# Patient Record
Sex: Male | Born: 1950 | Race: White | Hispanic: No | Marital: Single | Smoking: Never smoker
Health system: Southern US, Community
[De-identification: ages and names within clinical notes are randomized; demographics above are authoritative.]

## PROBLEM LIST (undated history)

## (undated) DIAGNOSIS — I10 Essential (primary) hypertension: Secondary | ICD-10-CM

## (undated) DIAGNOSIS — G510 Bell's palsy: Secondary | ICD-10-CM

## (undated) DIAGNOSIS — N289 Disorder of kidney and ureter, unspecified: Secondary | ICD-10-CM

## (undated) HISTORY — PX: TONSILECTOMY, ADENOIDECTOMY, BILATERAL MYRINGOTOMY AND TUBES: SHX2538

## (undated) HISTORY — PX: NASAL SEPTUM SURGERY: SHX37

---

## 2014-02-26 DIAGNOSIS — I10 Essential (primary) hypertension: Secondary | ICD-10-CM | POA: Insufficient documentation

## 2015-03-13 DIAGNOSIS — Z0000 Encounter for general adult medical examination without abnormal findings: Secondary | ICD-10-CM | POA: Insufficient documentation

## 2017-01-24 ENCOUNTER — Encounter: Payer: Self-pay | Admitting: Emergency Medicine

## 2017-01-24 ENCOUNTER — Emergency Department: Payer: BLUE CROSS/BLUE SHIELD

## 2017-01-24 ENCOUNTER — Emergency Department
Admission: EM | Admit: 2017-01-24 | Discharge: 2017-01-25 | Disposition: A | Discharge status: Home / Self Care | Payer: BLUE CROSS/BLUE SHIELD | Attending: Emergency Medicine | Admitting: Emergency Medicine

## 2017-01-24 DIAGNOSIS — G510 Bell's palsy: Secondary | ICD-10-CM | POA: Insufficient documentation

## 2017-01-24 DIAGNOSIS — R112 Nausea with vomiting, unspecified: Secondary | ICD-10-CM | POA: Diagnosis not present

## 2017-01-24 DIAGNOSIS — R1011 Right upper quadrant pain: Secondary | ICD-10-CM | POA: Insufficient documentation

## 2017-01-24 HISTORY — DX: Essential (primary) hypertension: I10

## 2017-01-24 HISTORY — DX: Bell's palsy: G51.0

## 2017-01-24 HISTORY — DX: Disorder of kidney and ureter, unspecified: N28.9

## 2017-01-24 LAB — URINALYSIS, COMPLETE (UACMP) WITH MICROSCOPIC
Bilirubin Urine: NEGATIVE
Glucose, UA: NEGATIVE mg/dL
HGB URINE DIPSTICK: NEGATIVE
KETONES UR: 80 mg/dL — AB
LEUKOCYTES UA: NEGATIVE
NITRITE: NEGATIVE
PROTEIN: NEGATIVE mg/dL
Specific Gravity, Urine: 1.026 (ref 1.005–1.030)
Squamous Epithelial / LPF: NONE SEEN
pH: 6 (ref 5.0–8.0)

## 2017-01-24 LAB — CBC
HCT: 44.9 % (ref 40.0–52.0)
Hemoglobin: 15.4 g/dL (ref 13.0–18.0)
MCH: 30.5 pg (ref 26.0–34.0)
MCHC: 34.3 g/dL (ref 32.0–36.0)
MCV: 88.7 fL (ref 80.0–100.0)
Platelets: 193 10*3/uL (ref 150–440)
RBC: 5.06 MIL/uL (ref 4.40–5.90)
RDW: 12.7 % (ref 11.5–14.5)
WBC: 15 10*3/uL — AB (ref 3.8–10.6)

## 2017-01-24 LAB — COMPREHENSIVE METABOLIC PANEL
ALK PHOS: 79 U/L (ref 38–126)
ALT: 22 U/L (ref 17–63)
ANION GAP: 12 (ref 5–15)
AST: 29 U/L (ref 15–41)
Albumin: 4.6 g/dL (ref 3.5–5.0)
BILIRUBIN TOTAL: 1.2 mg/dL (ref 0.3–1.2)
BUN: 18 mg/dL (ref 6–20)
CALCIUM: 9.9 mg/dL (ref 8.9–10.3)
CO2: 25 mmol/L (ref 22–32)
Chloride: 103 mmol/L (ref 101–111)
Creatinine, Ser: 1.21 mg/dL (ref 0.61–1.24)
GFR calc non Af Amer: >60 mL/min (ref >60)
Glucose, Bld: 188 mg/dL — ABNORMAL HIGH (ref 65–99)
Potassium: 4 mmol/L (ref 3.5–5.1)
Sodium: 140 mmol/L (ref 135–145)
TOTAL PROTEIN: 8.1 g/dL (ref 6.5–8.1)

## 2017-01-24 LAB — LIPASE, BLOOD: Lipase: 23 U/L (ref 11–51)

## 2017-01-24 MED ORDER — ONDANSETRON HCL 4 MG/2ML IJ SOLN
4.0000 mg | Freq: Once | INTRAMUSCULAR | Status: AC
  Administered 2017-01-24: 4 mg via INTRAVENOUS
  Filled 2017-01-24: qty 2

## 2017-01-24 MED ORDER — FENTANYL CITRATE (PF) 100 MCG/2ML IJ SOLN
50.0000 ug | INTRAMUSCULAR | Status: DC | PRN
  Administered 2017-01-24: 50 ug via NASAL
  Filled 2017-01-24: qty 2

## 2017-01-24 MED ORDER — IOPAMIDOL (ISOVUE-300) INJECTION 61%
100.0000 mL | Freq: Once | INTRAVENOUS | Status: AC | PRN
  Administered 2017-01-24: 100 mL via INTRAVENOUS

## 2017-01-24 MED ORDER — SODIUM CHLORIDE 0.9 % IV BOLUS (SEPSIS)
1000.0000 mL | Freq: Once | INTRAVENOUS | Status: AC
  Administered 2017-01-24: 1000 mL via INTRAVENOUS

## 2017-01-24 MED ORDER — MORPHINE SULFATE (PF) 4 MG/ML IV SOLN
4.0000 mg | Freq: Once | INTRAVENOUS | Status: AC
  Administered 2017-01-24: 4 mg via INTRAVENOUS
  Filled 2017-01-24: qty 1

## 2017-01-24 NOTE — Discharge Instructions (Signed)
Constipation: Take Miralax 1 cap full in the morning and one in the evening for 3-5 days. Take colace twice a day everyday. Take senna once a day at bedtime. Take daily probiotics. Drink plenty of fluids and eat a diet rich in fiber.   You have been seen in the Emergency Department (ED) for abdominal pain.  Your evaluation did not identify a clear cause of your symptoms but was generally reassuring.  Abdominal pain has many possible causes. Some aren't serious and get better on their own in a few days. Others need more testing and treatment. If your pain continues or gets worse, you need to be rechecked and may need more tests to find out what is wrong. You may need surgery to correct the problem.   Follow up with your doctor in 12-24 hours if you are still having abdominal pain. Otherwise follow up in 1-3 days for a re-check  Don't ignore new symptoms, such as fever, nausea and vomiting, new or worsening abdominal pain, urination problems, bloody diarrhea or bloody stools, black tarry stools, uncontrollable nausea and vomiting, and dizziness. These may be signs of a more serious problem. If you develop any of these you should be seen by your doctor immediately or return to the ED.   How can you care for yourself at home?  Rest until you feel better.  To prevent dehydration, drink plenty of fluids, enough so that your urine is light yellow or clear like water. Choose water and other caffeine-free clear liquids until you feel better. If you have kidney, heart, or liver disease and have to limit fluids, talk with your doctor before you increase the amount of fluids you drink.  If your stomach is upset, eat mild foods, such as rice, dry toast or crackers, bananas, and applesauce. Try eating several small meals instead of two or three large ones.  Wait until 48 hours after all symptoms have gone away before you have spicy foods, alcohol, and drinks that contain caffeine.  Do not eat foods that are high  in fat.  Avoid anti-inflammatory medicines such as aspirin, ibuprofen (Advil, Motrin), and naproxen (Aleve). These can cause stomach upset. Talk to your doctor if you take daily aspirin for another health problem.  When should you call for help?  Call 911 anytime you think you may need emergency care. For example, call if:  You passed out (lost consciousness).  You pass maroon or very bloody stools.  You vomit blood or what looks like coffee grounds.  You have new, severe belly pain.  Call your doctor now or seek immediate medical care if:  Your pain gets worse, especially if it becomes focused in one area of your belly.  You have a new or higher fever.  Your stools are black and look like tar, or they have streaks of blood.  You have unexpected vaginal bleeding.  You have symptoms of a urinary tract infection. These may include:  Pain when you urinate.  Urinating more often than usual.  Blood in your urine. You are dizzy or lightheaded, or you feel like you may faint. Watch closely for changes in your health, and be sure to contact your doctor if:  You are not getting better after 1 day (24 hours).

## 2017-01-24 NOTE — ED Notes (Signed)
Patient transported to CT 

## 2017-01-24 NOTE — ED Triage Notes (Signed)
Pt to ed with c/o acute onset of abd pain that started at 1330 today, reports nausea and vomiting x 2.

## 2017-01-24 NOTE — ED Notes (Signed)
Patient transported to Ultrasound 

## 2017-01-24 NOTE — ED Provider Notes (Signed)
[facility] Emergency Department Provider Note  ____________________________________________  Time seen: Approximately 9:53 PM  I have reviewed the triage vital signs and the nursing notes.   HISTORY  Chief Complaint Abdominal Pain   HPI Luis Marks is a 66 y.o. male with a history of Bell's palsy and hypertension who presents for evaluation of abdominal pain. Patient reports that his pain started at 1:30 PM today after eating 2 cheeseburgers for lunch. The pain is severe, located in his right upper quadrant/epigastric region, constant, sharp, nonradiating. Patient has been nauseated and has had 2 episodes of nonbloody nonbilious emesis. Patient reports one similar episode in the past which resolved within an hour. No prior history of kidney stones, no prior abdominal surgeries, no dysuria, no hematuria, no diarrhea, no constipation, no chest pain, no shortness of breath, no fever, no chills.  Past Medical History:  Diagnosis Date  . Bell's palsy   . Hypertension   . Renal disorder     There are no active problems to display for this patient.   History reviewed. No pertinent surgical history.  Prior to Admission medications   Not on File    Allergies Vicodin [hydrocodone-acetaminophen]  History reviewed. No pertinent family history.  Social History Social History  Substance Use Topics  . Smoking status: Never Smoker  . Smokeless tobacco: Never Used  . Alcohol use No    Review of Systems  Constitutional: Negative for fever. Eyes: Negative for visual changes. ENT: Negative for sore throat. Neck: No neck pain  Cardiovascular: Negative for chest pain. Respiratory: Negative for shortness of breath. Gastrointestinal: + RUQ abdominal pain, nausea, and vomiting. No diarrhea. Genitourinary: Negative for dysuria. Musculoskeletal: Negative for back pain. Skin: Negative for rash. Neurological: Negative for headaches, weakness or  numbness. Psych: No SI or HI  ____________________________________________   PHYSICAL EXAM:  VITAL SIGNS: ED Triage Vitals  Enc Vitals Group     BP 01/24/17 1931 (!) 173/95     Pulse Rate 01/24/17 1800 60     Resp 01/24/17 1800 20     Temp 01/24/17 1800 97.5 F (36.4 C)     Temp Source 01/24/17 1800 Oral     SpO2 01/24/17 1800 100 %     Weight 01/24/17 1801 165 lb (74.8 kg)     Height 01/24/17 1801 5\' 8"  (1.727 m)     Head Circumference --      Peak Flow --      Pain Score 01/24/17 1759 10     Pain Loc --      Pain Edu? --      Excl. in GC? --     Constitutional: Alert and oriented. Well appearing and in no apparent distress. HEENT:      Head: Normocephalic and atraumatic.         Eyes: Conjunctivae are normal. Sclera is non-icteric.       Mouth/Throat: Mucous membranes are moist.       Neck: Supple with no signs of meningismus. Cardiovascular: Regular rate and rhythm. No murmurs, gallops, or rubs. 2+ symmetrical distal pulses are present in all extremities. No JVD. Respiratory: Normal respiratory effort. Lungs are clear to auscultation bilaterally. No wheezes, crackles, or rhonchi.  Gastrointestinal: Soft, tenderness to palpation of the right upper quadrant with negative Murphy sign, and non distended with positive bowel sounds. No rebound or guarding. Genitourinary: No CVA tenderness. Musculoskeletal: Nontender with normal range of motion in all extremities. No edema, cyanosis, or erythema of extremities.  Neurologic: Normal speech and language. Face is symmetric. Moving all extremities. No gross focal neurologic deficits are appreciated. Skin: Skin is warm, dry and intact. No rash noted. Psychiatric: Mood and affect are normal. Speech and behavior are normal.  ____________________________________________   LABS (all labs ordered are listed, but only abnormal results are displayed)  Labs Reviewed  COMPREHENSIVE METABOLIC PANEL - Abnormal; Notable for the following:         Result Value   Glucose, Bld 188 (*)    All other components within normal limits  CBC - Abnormal; Notable for the following:    WBC 15.0 (*)    All other components within normal limits  URINALYSIS, COMPLETE (UACMP) WITH MICROSCOPIC - Abnormal; Notable for the following:    Color, Urine YELLOW (*)    APPearance CLEAR (*)    Ketones, ur 80 (*)    Bacteria, UA RARE (*)    All other components within normal limits  LIPASE, BLOOD   ____________________________________________  EKG  none  ____________________________________________  RADIOLOGY  RUQ US: Echogenic appearance of the liver parenchyma consistent with fatty infiltration. No acute gallbladder abnormality.  CT a/p: 1. There are no acute abnormalities visualized. 2. Moderate left fatty inguinal hernia. ____________________________________________   PROCEDURES  Procedure(s) performed: None Procedures Critical Care performed:  None ____________________________________________   INITIAL IMPRESSION / ASSESSMENT AND PLAN / ED COURSE  66 y.o. male with a history of Bell's palsy and hypertension who presents for evaluation of sudden onset RUQ abdominal pain after eating 2 cheeseburgers associated with nausea and vomiting. Patient is well-appearing, in no distress, normal vital signs, he is tender to palpation the right upper quadrant with negative Murphy sign. Blood work showed a leukocytosis with white count of 15. Normal CMP and lipase. UA with no evidence of infection or blood. We'll give morphine, fluids and Zofran for symptom relief. Ddx Cholecystitis versus cholelithiasis versus kidney stone versus diverticulitis. We'll send patient for right upper quadrant ultrasound to evaluate for acute cholecystitis.  Clinical Course as of Jan 25 2340  Fri Jan 24, 2017  2251 Right upper quadrant ultrasound consistent with fatty liver disease but no evidence of gallstones or cholecystitis. Patient continues to complain of pain  however improved after morphine. Was sent for CT abdomen and pelvis to evaluate for kidney stones versus diverticulitis.  [CV]  2337 CT with no acute findings. Patient has significant stool burden on the ascending colon. We'll send home on MiraLAX and Colace, increase oral hydration, and follow-up with his doctor tomorrow morning for recheck. Recommended that he returns to the emergency room if the pain gets worse or if he develops any new symptoms that are not present at this time.  [CV]    Clinical Course User Index [CV] Nita SickleVeronese, [place], MD    Pertinent labs & imaging results that were available during my care of the patient were reviewed by me and considered in my medical decision making (see chart for details).    ____________________________________________   FINAL CLINICAL IMPRESSION(S) / ED DIAGNOSES  Final diagnoses:  RUQ abdominal pain      NEW MEDICATIONS STARTED DURING THIS VISIT:  New Prescriptions   No medications on file     Note:  This document was prepared using Dragon voice recognition software and may include unintentional dictation errors.    Nita SickleVeronese, [place], MD 01/24/17 [phone]

## 2017-01-25 MED ORDER — OXYCODONE-ACETAMINOPHEN 5-325 MG PO TABS
1.0000 | ORAL_TABLET | Freq: Once | ORAL | Status: AC
  Administered 2017-01-25: 1 via ORAL

## 2017-01-25 MED ORDER — OXYCODONE-ACETAMINOPHEN 5-325 MG PO TABS
ORAL_TABLET | ORAL | Status: AC
  Administered 2017-01-25: 1 via ORAL
  Filled 2017-01-25: qty 1

## 2017-01-25 NOTE — ED Notes (Signed)
Pt. Going home with family. 

## 2017-01-27 ENCOUNTER — Emergency Department: Payer: BLUE CROSS/BLUE SHIELD

## 2017-01-27 ENCOUNTER — Inpatient Hospital Stay
Admission: EM | Admit: 2017-01-27 | Discharge: 2017-02-01 | DRG: 415 | Disposition: A | Discharge status: Home / Self Care | Payer: BLUE CROSS/BLUE SHIELD | Attending: Surgery | Admitting: Surgery

## 2017-01-27 DIAGNOSIS — Z885 Allergy status to narcotic agent status: Secondary | ICD-10-CM

## 2017-01-27 DIAGNOSIS — K810 Acute cholecystitis: Secondary | ICD-10-CM | POA: Diagnosis not present

## 2017-01-27 DIAGNOSIS — R52 Pain, unspecified: Secondary | ICD-10-CM

## 2017-01-27 DIAGNOSIS — I10 Essential (primary) hypertension: Secondary | ICD-10-CM | POA: Diagnosis present

## 2017-01-27 DIAGNOSIS — Z7982 Long term (current) use of aspirin: Secondary | ICD-10-CM | POA: Diagnosis not present

## 2017-01-27 DIAGNOSIS — K8000 Calculus of gallbladder with acute cholecystitis without obstruction: Secondary | ICD-10-CM | POA: Diagnosis present

## 2017-01-27 DIAGNOSIS — Z419 Encounter for procedure for purposes other than remedying health state, unspecified: Secondary | ICD-10-CM

## 2017-01-27 DIAGNOSIS — I9752 Accidental puncture and laceration of a circulatory system organ or structure during other procedure: Secondary | ICD-10-CM | POA: Diagnosis not present

## 2017-01-27 DIAGNOSIS — R1011 Right upper quadrant pain: Secondary | ICD-10-CM | POA: Diagnosis present

## 2017-01-27 DIAGNOSIS — R109 Unspecified abdominal pain: Secondary | ICD-10-CM

## 2017-01-27 LAB — CBC
HCT: 42.2 % (ref 40.0–52.0)
Hemoglobin: 14.7 g/dL (ref 13.0–18.0)
MCH: 31 pg (ref 26.0–34.0)
MCHC: 34.7 g/dL (ref 32.0–36.0)
MCV: 89.3 fL (ref 80.0–100.0)
PLATELETS: 168 10*3/uL (ref 150–440)
RBC: 4.73 MIL/uL (ref 4.40–5.90)
RDW: 12.9 % (ref 11.5–14.5)
WBC: 19.8 10*3/uL — ABNORMAL HIGH (ref 3.8–10.6)

## 2017-01-27 LAB — COMPREHENSIVE METABOLIC PANEL
ALBUMIN: 3.5 g/dL (ref 3.5–5.0)
ALT: 40 U/L (ref 17–63)
AST: 33 U/L (ref 15–41)
Alkaline Phosphatase: 108 U/L (ref 38–126)
Anion gap: 8 (ref 5–15)
BUN: 16 mg/dL (ref 6–20)
CALCIUM: 9 mg/dL (ref 8.9–10.3)
CO2: 26 mmol/L (ref 22–32)
Chloride: 95 mmol/L — ABNORMAL LOW (ref 101–111)
Creatinine, Ser: 1.17 mg/dL (ref 0.61–1.24)
GFR calc non Af Amer: >60 mL/min (ref >60)
GLUCOSE: 167 mg/dL — AB (ref 65–99)
Potassium: 3 mmol/L — ABNORMAL LOW (ref 3.5–5.1)
SODIUM: 129 mmol/L — AB (ref 135–145)
Total Bilirubin: 2.2 mg/dL — ABNORMAL HIGH (ref 0.3–1.2)
Total Protein: 7.8 g/dL (ref 6.5–8.1)

## 2017-01-27 LAB — LIPASE, BLOOD: Lipase: 21 U/L (ref 11–51)

## 2017-01-27 MED ORDER — KETOROLAC TROMETHAMINE 30 MG/ML IJ SOLN
30.0000 mg | Freq: Once | INTRAMUSCULAR | Status: AC
  Administered 2017-01-27: 30 mg via INTRAVENOUS
  Filled 2017-01-27: qty 1

## 2017-01-27 MED ORDER — HEPARIN SODIUM (PORCINE) 5000 UNIT/ML IJ SOLN
5000.0000 [IU] | Freq: Three times a day (TID) | INTRAMUSCULAR | Status: DC
  Administered 2017-01-27 – 2017-02-01 (×13): 5000 [IU] via SUBCUTANEOUS
  Filled 2017-01-27 (×13): qty 1

## 2017-01-27 MED ORDER — FENTANYL CITRATE (PF) 100 MCG/2ML IJ SOLN: 50.0000 ug | INTRAMUSCULAR | Status: DC | PRN

## 2017-01-27 MED ORDER — ONDANSETRON HCL 4 MG/2ML IJ SOLN: 4.0000 mg | Freq: Four times a day (QID) | INTRAMUSCULAR | Status: DC | PRN

## 2017-01-27 MED ORDER — KCL IN DEXTROSE-NACL 20-5-0.45 MEQ/L-%-% IV SOLN
INTRAVENOUS | Status: DC
  Administered 2017-01-27 – 2017-01-28 (×2): via INTRAVENOUS
  Filled 2017-01-27 (×4): qty 1000

## 2017-01-27 MED ORDER — PIPERACILLIN-TAZOBACTAM 3.375 G IVPB 30 MIN
3.3750 g | Freq: Once | INTRAVENOUS | Status: AC
  Administered 2017-01-27: 3.375 g via INTRAVENOUS
  Filled 2017-01-27: qty 50

## 2017-01-27 MED ORDER — ONDANSETRON 4 MG PO TBDP: 4.0000 mg | ORAL_TABLET | Freq: Four times a day (QID) | ORAL | Status: DC | PRN

## 2017-01-27 MED ORDER — PIPERACILLIN-TAZOBACTAM 3.375 G IVPB
3.3750 g | Freq: Three times a day (TID) | INTRAVENOUS | Status: DC
  Administered 2017-01-28 – 2017-02-01 (×12): 3.375 g via INTRAVENOUS
  Filled 2017-01-27 (×15): qty 50

## 2017-01-27 NOTE — H&P (Signed)
SURGICAL ADMISSION HISTORY & PHYSICAL (cpt: [phone])  HISTORY OF PRESENT ILLNESS (HPI):  66 y.o. male [facility] as instructed by PMD for persistent abdominal pain with worsened leukocytosis and hyperbilirubinemia on outpatient labs since initial ED presentation 3.5 days ago. Patient [facility] evening after developing severe unrelenting RUQ abdominal pain 30 minutes after eating 2 cheeseburgers. Patient reports he may have experienced a few prior similar lesser episodes of abdominal pain, but never severe enough to seek evaluation.   Since discharged from ED with a diagnosis of constipation, the pain for which he's been taking Tylenol "like candy" has not improved. Patient this afternoon took a Tramadol prescribed by his PMD before drinking a milkshake (the only food he's eaten since discharge from ED except water and other clear liquids). Over the past 3.5 days, patient recalls mild transient subjective fever/chills, but has not checked his temperature, and each has self-resolved without intervention. Patient currently reports his RUQ abdominal pain is well-controlled with +flatus, but no BM x 4 days. 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  PAST MEDICAL HISTORY (PMH):  Past Medical History:  Diagnosis Date  . Bell's palsy   . Hypertension   . Renal disorder    PAST SURGICAL HISTORY (PSH):  History reviewed. No pertinent surgical history.   MEDICATIONS:  Prior to Admission medications   Medication Sig Start Date End Date Taking? Authorizing Provider  aspirin EC 81 MG tablet Take 81 mg by mouth daily.   Yes [provider]  bisoprolol-hydrochlorothiazide (ZIAC) 5-6.25 MG tablet Take 1 tablet by mouth daily. 01/19/17  Yes [provider]  pantoprazole (PROTONIX) 40 MG tablet Take 40 mg by mouth daily. 01/27/17  Yes  [provider]  traMADol (ULTRAM) 50 MG tablet Take 100 mg by mouth every 6 (six) hours as needed for pain. 01/27/17  Yes [provider]     ALLERGIES:  Allergies  Allergen Reactions  . Vicodin [Hydrocodone-Acetaminophen] Other (See Comments)     SOCIAL HISTORY:  Social History   Social History  . Marital status: Single    Spouse name: N/A  . Number of children: N/A  . Years of education: N/A   Occupational History  . Not on file.   Social History Main Topics  . Smoking status: Never Smoker  . Smokeless tobacco: Never Used  . Alcohol use No  . Drug use: No  . Sexual activity: Not on file   Other Topics Concern  . Not on file   Social History Narrative  . No narrative on file    The patient currently resides (home / rehab facility / nursing home): Home  The patient normally is (ambulatory / bedbound): Ambulatory   FAMILY HISTORY:  No family history on file.   REVIEW OF SYSTEMS:  Constitutional: denies weight loss, fever, chills, or sweats  Eyes: denies any other vision changes, history of eye injury  ENT: denies sore throat, hearing problems  Respiratory: denies shortness of breath, wheezing  Cardiovascular: denies chest pain, palpitations  Gastrointestinal: abdominal pain, N/V, and bowel function as per HPI Genitourinary: denies burning with urination or urinary frequency Musculoskeletal: denies any other joint pains or cramps  Skin: denies any other rashes or skin discolorations  Neurological: denies any other headache, dizziness, weakness  Psychiatric: denies any other depression, anxiety   All other review  of systems were negative   VITAL SIGNS:  Temp:  [98.5 F (36.9 C)] 98.5 F (36.9 C) (06/18 1821) Pulse Rate:  [71-87] 71 (06/18 1930) Resp:  [16] 16 (06/18 1821) BP: (124-129)/(86-95) 125/91 (06/18 1930) SpO2:  [92 %-100 %] 92 % (06/18 1930) Weight:  [165 lb (74.8 kg)] 165 lb (74.8 kg) (06/18 1821)     Height: 5\' 8"  (172.7 cm)  Weight: 165 lb (74.8 kg) BMI (Calculated): 25.1   INTAKE/OUTPUT:  This shift: No intake/output data recorded.  Last 2 shifts: @IOLAST2SHIFTS @   PHYSICAL EXAM:  Constitutional:  -- Normal body habitus  -- Awake, alert, and oriented x3  Eyes:  -- Pupils equally round and reactive to light  -- No scleral icterus  Ear, nose, and throat:  -- No jugular venous distension  Pulmonary:  -- No crackles  -- Equal breath sounds bilaterally -- Breathing non-labored at rest Cardiovascular:  -- S1, S2 present  -- No pericardial rubs Gastrointestinal:  -- Abdomen soft and nondistended with focal moderate RUQ abdominal tenderness to palpation, no guarding/rebound  -- No abdominal masses appreciated, pulsatile or otherwise  Musculoskeletal and Integumentary:  -- Wounds or skin discoloration: None appreciated -- Extremities: B/L UE and LE FROM, hands and feet warm, no edema  Neurologic:  -- Motor function: intact and symmetric -- Sensation: intact and symmetric  Labs:  CBC Latest Ref Rng & Units 01/27/2017 01/24/2017  WBC 3.8 - 10.6 K/uL 19.8(H) 15.0(H)  Hemoglobin 13.0 - 18.0 g/dL 86.5 78.4  Hematocrit 69.6 - 52.0 % 42.2 44.9  Platelets 150 - 440 K/uL 168 193   CMP Latest Ref Rng & Units 01/27/2017 01/24/2017  Glucose 65 - 99 mg/dL 295(M) 841(L)  BUN 6 - 20 mg/dL 16 18  Creatinine 2.44 - 1.24 mg/dL 0.10 2.72  Sodium 536 - 145 mmol/L 129(L) 140  Potassium 3.5 - 5.1 mmol/L 3.0(L) 4.0  Chloride 101 - 111 mmol/L 95(L) 103  CO2 22 - 32 mmol/L 26 25  Calcium 8.9 - 10.3 mg/dL 9.0 9.9  Total Protein 6.5 - 8.1 g/dL 7.8 8.1  Total Bilirubin 0.3 - 1.2 mg/dL 2.2(H) 1.2  Alkaline Phos 38 - 126 U/L 108 79  AST 15 - 41 U/L 33 29  ALT 17 - 63 U/L 40 22    Imaging studies:  Limited RUQ Abdominal Ultrasound (01/27/2017) - personally reviewed and discussed with patient Significant sludge within gallbladder new since prior study. Gallbladder wall thickening new since prior study. Probable gallbladder  polyp 3 mm diameter though not noted on the previous study. Minimal pericholecystic fluid. No sonographic Murphy sign.  Common bile duct diameter measures 3 mm diameter (normal)  Heterogeneous hepatic echogenicity question fatty infiltration, though this can also be seen with cirrhosis and certain infiltrative disorders. No discrete hepatic mass or intrahepatic biliary dilatation. Hepatopetal portal venous flow.  No RIGHT upper quadrant free fluid  Limited RUQ Abdominal Ultrasound (01/24/2017) - personally reviewed and discussed with patient No gallstones or wall thickening visualized. No sonographic Murphy sign noted by sonographer. Common bile duct diameter measures 2.8 mm. Mildly echogenic appearance of the liver consistent with fatty infiltration.  CT Abdomen and Pelvis with Contrast (01/24/2017) 1. There are no acute abnormalities visualized. 2. Moderate left fatty inguinal hernia.  Assessment/Plan: (ICD-10's: K81.0) 66 y.o. male with acute cholecystitis with reactive hyperbilirubinemia vs choledochomicrolithiasis (CBD biliary sludge), though the latter seems less likely with CBD diameter WNL, complicated by pertinent comorbidities including HTN and chronic Bell's palsy reportedly since patient was in  his teens).   - NPO, IVF  - will admit to surgical service  - repeat bilirubin (CMP) and WBC (CBC) in am  - cholecystectomy prior to discharge +/- ERCP were discussed with patient  - empiric Zosyn considering acute cholecystitis with possible choledochomicrolithiasis and risk of ascending cholangitis  - will tentatively schedule for laparoscopic cholecystectomy tomorrow  - am GI consultation [facility] on-call) if bilirubin increases  - pain control prn, minimize narcotics  - DVT prophylaxis  All of the above findings and recommendations were discussed with the patient, his family, and ED physician, and all of patient's and his family's questions were answered to their expressed  satisfaction.  -- Scherrie Gerlach Earlene Plater, MD, RPVI [place]: [facility] General Surgery - Partnering for exceptional care. Office: [phone]

## 2017-01-27 NOTE — ED Triage Notes (Signed)
RUQ pain since Friday. Seen in ER Friday and discharged. Pt saw PCP today and had labs drawn, 21WBC and high bilirubin per family. Pt alert and oriented X4, active, cooperative, pt in NAD. RR even and unlabored, color WNL.  Pt took Tramadol PTA.

## 2017-01-27 NOTE — Consult Note (Signed)
SURGICAL CONSULTATION NOTE (initial)  HISTORY OF PRESENT ILLNESS (HPI):  66 y.o. male presented to [facility] ED as instructed by PMD for persistent abdominal pain with worsened leukocytosis and hyperbilirubinemia on outpatient labs since initial ED presentation 3.5 days ago. Patient initially presented to [facility] ED Friday evening after developing severe unrelenting RUQ abdominal pain 30 minutes after eating 2 cheeseburgers. Patient reports he may have experienced a few prior similar lesser episodes of abdominal pain, but never severe enough to seek evaluation.   Since discharged from ED with a diagnosis of constipation, the pain for which he's been taking Tylenol "like candy" has not improved. Patient this afternoon took a Tramadol prescribed by his PMD before drinking a milkshake (the only food he's eaten since discharge from ED except water and other clear liquids). Over the past 3.5 days, patient recalls mild transient subjective fever/chills, but has not checked his temperature, and each has self-resolved without intervention. Patient currently reports his RUQ abdominal pain is well-controlled with +flatus, but no BM x 4 days. 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  Surgery is consulted by ED physician Dr. Alphonzo Lemmings in this context for evaluation and management of acute cholecystitis +/- choledochomicrolithiasis (biliary sludge of CBD).  PAST MEDICAL HISTORY (PMH):  Past Medical History:  Diagnosis Date  . Bell's palsy   . Hypertension   . Renal disorder      PAST SURGICAL HISTORY (PSH):  History reviewed. No pertinent surgical history.   MEDICATIONS:  Prior to Admission medications   Medication Sig Start Date End Date Taking? Authorizing Provider  aspirin EC 81 MG tablet Take 81 mg by mouth daily.   Yes [provider]  bisoprolol-hydrochlorothiazide (ZIAC)  5-6.25 MG tablet Take 1 tablet by mouth daily. 01/19/17  Yes [provider]  pantoprazole (PROTONIX) 40 MG tablet Take 40 mg by mouth daily. 01/27/17  Yes [provider]  traMADol (ULTRAM) 50 MG tablet Take 100 mg by mouth every 6 (six) hours as needed for pain. 01/27/17  Yes [provider]     ALLERGIES:  Allergies  Allergen Reactions  . Vicodin [Hydrocodone-Acetaminophen] Other (See Comments)     SOCIAL HISTORY:  Social History   Social History  . Marital status: Single    Spouse name: N/A  . Number of children: N/A  . Years of education: N/A   Occupational History  . Not on file.   Social History Main Topics  . Smoking status: Never Smoker  . Smokeless tobacco: Never Used  . Alcohol use No  . Drug use: No  . Sexual activity: Not on file   Other Topics Concern  . Not on file   Social History Narrative  . No narrative on file    The patient currently resides (home / rehab facility / nursing home): Home  The patient normally is (ambulatory / bedbound): Ambulatory   FAMILY HISTORY:  No family history on file.   REVIEW OF SYSTEMS:  Constitutional: denies weight loss, fever, chills, or sweats  Eyes: denies any other vision changes, history of eye injury  ENT: denies sore throat, hearing problems  Respiratory: denies shortness of breath, wheezing  Cardiovascular: denies chest pain, palpitations  Gastrointestinal: abdominal pain, N/V, and bowel function as per HPI Genitourinary: denies burning with urination or urinary frequency Musculoskeletal: denies any other joint pains or cramps  Skin: denies any other  rashes or skin discolorations  Neurological: denies any other headache, dizziness, weakness  Psychiatric: denies any other depression, anxiety   All other review of systems were negative   VITAL SIGNS:  Temp:  [98.5 F (36.9 C)] 98.5 F (36.9 C) (06/18 1821) Pulse Rate:  [71-87] 71 (06/18 1930) Resp:  [16] 16 (06/18 1821) BP:  (124-129)/(86-95) 125/91 (06/18 1930) SpO2:  [92 %-100 %] 92 % (06/18 1930) Weight:  [165 lb (74.8 kg)] 165 lb (74.8 kg) (06/18 1821)     Height: 5\' 8"  (172.7 cm) Weight: 165 lb (74.8 kg) BMI (Calculated): 25.1   INTAKE/OUTPUT:  This shift: No intake/output data recorded.  Last 2 shifts: @IOLAST2SHIFTS @   PHYSICAL EXAM:  Constitutional:  -- Normal body habitus  -- Awake, alert, and oriented x3  Eyes:  -- Pupils equally round and reactive to light  -- No scleral icterus  Ear, nose, and throat:  -- No jugular venous distension  Pulmonary:  -- No crackles  -- Equal breath sounds bilaterally -- Breathing non-labored at rest Cardiovascular:  -- S1, S2 present  -- No pericardial rubs Gastrointestinal:  -- Abdomen soft and nondistended with focal moderate RUQ abdominal tenderness to palpation, no guarding/rebound  -- No abdominal masses appreciated, pulsatile or otherwise  Musculoskeletal and Integumentary:  -- Wounds or skin discoloration: None appreciated -- Extremities: B/L UE and LE FROM, hands and feet warm, no edema  Neurologic:  -- Motor function: intact and symmetric -- Sensation: intact and symmetric  Labs:  CBC Latest Ref Rng & Units 01/27/2017 01/24/2017  WBC 3.8 - 10.6 K/uL 19.8(H) 15.0(H)  Hemoglobin 13.0 - 18.0 g/dL 13.014.7 86.515.4  Hematocrit 78.440.0 - 52.0 % 42.2 44.9  Platelets 150 - 440 K/uL 168 193   CMP Latest Ref Rng & Units 01/27/2017 01/24/2017  Glucose 65 - 99 mg/dL 696(E167(H) 952(W188(H)  BUN 6 - 20 mg/dL 16 18  Creatinine 4.130.61 - 1.24 mg/dL 2.441.17 0.101.21  Sodium 272135 - 145 mmol/L 129(L) 140  Potassium 3.5 - 5.1 mmol/L 3.0(L) 4.0  Chloride 101 - 111 mmol/L 95(L) 103  CO2 22 - 32 mmol/L 26 25  Calcium 8.9 - 10.3 mg/dL 9.0 9.9  Total Protein 6.5 - 8.1 g/dL 7.8 8.1  Total Bilirubin 0.3 - 1.2 mg/dL 2.2(H) 1.2  Alkaline Phos 38 - 126 U/L 108 79  AST 15 - 41 U/L 33 29  ALT 17 - 63 U/L 40 22    Imaging studies:  Limited RUQ Abdominal Ultrasound (01/27/2017) - personally  reviewed and discussed with patient Significant sludge within gallbladder new since prior study. Gallbladder wall thickening new since prior study. Probable gallbladder polyp 3 mm diameter though not noted on the previous study. Minimal pericholecystic fluid. No sonographic Murphy sign.  Common bile duct diameter measures 3 mm diameter (normal)  Heterogeneous hepatic echogenicity question fatty infiltration, though this can also be seen with cirrhosis and certain infiltrative disorders. No discrete hepatic mass or intrahepatic biliary dilatation. Hepatopetal portal venous flow.  No RIGHT upper quadrant free fluid  Limited RUQ Abdominal Ultrasound (01/24/2017) - personally reviewed and discussed with patient No gallstones or wall thickening visualized. No sonographic Murphy sign noted by sonographer. Common bile duct diameter measures 2.8 mm. Mildly echogenic appearance of the liver consistent with fatty infiltration.  CT Abdomen and Pelvis with Contrast (01/24/2017) 1. There are no acute abnormalities visualized. 2. Moderate left fatty inguinal hernia.  Assessment/Plan: (ICD-10's: K81.0) 66 y.o. male with acute cholecystitis with reactive hyperbilirubinemia vs choledochomicrolithiasis (CBD biliary sludge), though  the latter seems less likely with CBD diameter WNL, complicated by pertinent comorbidities including HTN and chronic Bell's palsy reportedly since patient was in his teens).   - NPO, IVF  - will admit to surgical service  - repeat bilirubin (CMP) and WBC (CBC) in am  - cholecystectomy prior to discharge +/- ERCP were discussed with patient  - empiric Zosyn considering acute cholecystitis with possible choledochomicrolithiasis and risk of ascending cholangitis  - will tentatively schedule for laparoscopic cholecystectomy tomorrow  - am GI consultation [facility] on-call) if bilirubin increases  - pain control prn, minimize narcotics  - DVT prophylaxis  All of the above  findings and recommendations were discussed with the patient, his family, and ED physician, and all of patient's and his family's questions were answered to their expressed satisfaction.  Thank you for the opportunity to participate in this patient's care.   -- Scherrie Gerlach Earlene Plater, MD, RPVI [place]: [facility] - Partnering for exceptional care. Office: [phone]

## 2017-01-27 NOTE — ED Provider Notes (Addendum)
[facility] Emergency Department Provider Note  ____________________________________________   I have reviewed the triage vital signs and the nursing notes.   HISTORY  Chief Complaint Abdominal Pain    HPI Bernd Crom is a 66 y.o. male presents today complaining of abdominal pain and right upper quadrant. Patient was seen here 3 days ago which time his white count was 15 and his ultrasound was unremarkable. However, pain is persisted his bilirubin is not elevated and his white count is elevated according to patient's PCP obtained results. Patient has not had a documented fever but he has been taking Tylenol around-the-clock for pain. He states he is taking it as directed. He has had increasing and persistent right upper quadrant pain since the initial visit. Nothing makes it better nothing makes it worse except for he did have some relief with tramadol today. Patient is still having pain however. The patient has not vomited since the first day. It is a sharp nonradiating right upper quadrant focal pain  Past Medical History:  Diagnosis Date  . Bell's palsy   . Hypertension   . Renal disorder     There are no active problems to display for this patient.   History reviewed. No pertinent surgical history.  Prior to Admission medications   Not on File    Allergies Vicodin [hydrocodone-acetaminophen]  No family history on file.  Social History Social History  Substance Use Topics  . Smoking status: Never Smoker  . Smokeless tobacco: Never Used  . Alcohol use No    Review of Systems Constitutional: Positive chills no fever Eyes: No visual changes. ENT: No sore throat. No stiff neck no neck pain Cardiovascular: Denies chest pain. Respiratory: Denies shortness of breath. Gastrointestinal:   no vomitingSince initial visit.  No diarrhea.  No constipation. Genitourinary: Negative for dysuria. Musculoskeletal: Negative lower extremity  swelling Skin: Negative for rash. Neurological: Negative for severe headaches, focal weakness or numbness.   ____________________________________________   PHYSICAL EXAM:  VITAL SIGNS: ED Triage Vitals  Enc Vitals Group     BP 01/27/17 1821 124/86     Pulse Rate 01/27/17 1821 87     Resp 01/27/17 1821 16     Temp 01/27/17 1821 98.5 F (36.9 C)     Temp Source 01/27/17 1821 Oral     SpO2 01/27/17 1821 100 %     Weight 01/27/17 1821 165 lb (74.8 kg)     Height 01/27/17 1821 5\' 8"  (1.727 m)     Head Circumference --      Peak Flow --      Pain Score 01/27/17 1820 5     Pain Loc --      Pain Edu? --      Excl. in GC? --     Constitutional: Alert and oriented. Well appearing and in no acute distress. Eyes: Conjunctivae are normal Head: Atraumatic HEENT: No congestion/rhinnorhea. Mucous membranes are moist.  Oropharynx non-erythematous Neck:   Nontender with no meningismus, no masses, no stridor Cardiovascular: Normal rate, regular rhythm. Grossly normal heart sounds.  Good peripheral circulation. Respiratory: Normal respiratory effort.  No retractions. Lungs CTAB. Abdominal: Soft and Focal tenderness to palpation right upper quadrant. No distention. Voluntary guarding no rebound Back:  There is no focal tenderness or step off.  there is no midline tenderness there are no lesions noted. there is no CVA tenderness Musculoskeletal: No lower extremity tenderness, no upper extremity tenderness. No joint effusions, no DVT signs strong distal pulses no  edema Neurologic:  Normal speech and language. No gross focal neurologic deficits are appreciated.  Skin:  Skin is warm, dry and intact. No rash noted. Psychiatric: Mood and affect are normal. Speech and behavior are normal.  ____________________________________________   LABS (all labs ordered are listed, but only abnormal results are displayed)  Labs Reviewed  COMPREHENSIVE METABOLIC PANEL - Abnormal; Notable for the following:        Result Value   Sodium 129 (*)    Potassium 3.0 (*)    Chloride 95 (*)    Glucose, Bld 167 (*)    Total Bilirubin 2.2 (*)    All other components within normal limits  CBC - Abnormal; Notable for the following:    WBC 19.8 (*)    All other components within normal limits  LIPASE, BLOOD  URINALYSIS, COMPLETE (UACMP) WITH MICROSCOPIC   ____________________________________________  EKG  I personally interpreted any EKGs ordered by me or triage  ____________________________________________  RADIOLOGY  I reviewed any imaging ordered by me or triage that were performed during my shift and, if possible, patient and/or family made aware of any abnormal findings. ____________________________________________   PROCEDURES  Procedure(s) performed: None  Procedures  Critical Care performed: None  ____________________________________________   INITIAL IMPRESSION / ASSESSMENT AND PLAN / ED COURSE  Pertinent labs & imaging results that were available during my care of the patient were reviewed by me and considered in my medical decision making (see chart for details).  Patient here with elevated bilirubin and elevated white count focal abdominal pain still persistent over the gallbladder, did therefore repeat his ultrasound, official read of the ultrasound is pending but is ultrasound to me appears to have some evidence of wall edema as well as possible sludge. This is new from prior ultrasound. I discussed with Dr. Earlene Plateravis of surgery who is evaluating the patient's ultrasound  And will admit.    ____________________________________________   FINAL CLINICAL IMPRESSION(S) / ED DIAGNOSES  Final diagnoses:  Pain  Abdominal pain, unspecified abdominal location      This chart was dictated using voice recognition software.  Despite best efforts to proofread,  errors can occur which can change meaning.      Jeanmarie PlantMcShane, Braydan Marriott A, MD 01/27/17 1931    Jeanmarie PlantMcShane, Quentavious Rittenhouse A,  MD 01/27/17 [phone]

## 2017-01-28 ENCOUNTER — Inpatient Hospital Stay: Payer: BLUE CROSS/BLUE SHIELD | Admitting: Certified Registered"

## 2017-01-28 ENCOUNTER — Encounter
Admission: EM | Disposition: A | Discharge status: Home / Self Care | Payer: Self-pay | Source: Home / Self Care | Attending: Surgery

## 2017-01-28 ENCOUNTER — Encounter: Payer: Self-pay | Admitting: Anesthesiology

## 2017-01-28 ENCOUNTER — Inpatient Hospital Stay: Payer: BLUE CROSS/BLUE SHIELD

## 2017-01-28 DIAGNOSIS — K810 Acute cholecystitis: Secondary | ICD-10-CM

## 2017-01-28 HISTORY — PX: CHOLECYSTECTOMY: SHX55

## 2017-01-28 LAB — COMPREHENSIVE METABOLIC PANEL
ALBUMIN: 3 g/dL — AB (ref 3.5–5.0)
ALT: 30 U/L (ref 17–63)
AST: 22 U/L (ref 15–41)
Alkaline Phosphatase: 90 U/L (ref 38–126)
Anion gap: 8 (ref 5–15)
BILIRUBIN TOTAL: 1.8 mg/dL — AB (ref 0.3–1.2)
BUN: 17 mg/dL (ref 6–20)
CO2: 30 mmol/L (ref 22–32)
Calcium: 8.6 mg/dL — ABNORMAL LOW (ref 8.9–10.3)
Chloride: 97 mmol/L — ABNORMAL LOW (ref 101–111)
Creatinine, Ser: 1.22 mg/dL (ref 0.61–1.24)
GFR calc Af Amer: >60 mL/min (ref >60)
GFR calc non Af Amer: >60 mL/min (ref >60)
GLUCOSE: 116 mg/dL — AB (ref 65–99)
Potassium: 2.9 mmol/L — ABNORMAL LOW (ref 3.5–5.1)
Sodium: 135 mmol/L (ref 135–145)
TOTAL PROTEIN: 6.6 g/dL (ref 6.5–8.1)

## 2017-01-28 LAB — CBC
HCT: 39 % — ABNORMAL LOW (ref 40.0–52.0)
HCT: 33.3 % — ABNORMAL LOW (ref 40.0–52.0)
Hemoglobin: 13.4 g/dL (ref 13.0–18.0)
Hemoglobin: 11.4 g/dL — ABNORMAL LOW (ref 13.0–18.0)
MCH: 31.3 pg (ref 26.0–34.0)
MCH: 30.9 pg (ref 26.0–34.0)
MCHC: 34.3 g/dL (ref 32.0–36.0)
MCHC: 34.3 g/dL (ref 32.0–36.0)
MCV: 91.4 fL (ref 80.0–100.0)
MCV: 90.2 fL (ref 80.0–100.0)
Platelets: 147 10*3/uL — ABNORMAL LOW (ref 150–440)
Platelets: 183 K/uL (ref 150–440)
RBC: 4.26 MIL/uL — ABNORMAL LOW (ref 4.40–5.90)
RBC: 3.7 MIL/uL — ABNORMAL LOW (ref 4.40–5.90)
RDW: 12.5 % (ref 11.5–14.5)
RDW: 12.9 % (ref 11.5–14.5)
WBC: 12.7 10*3/uL — ABNORMAL HIGH (ref 3.8–10.6)
WBC: 15.7 K/uL — ABNORMAL HIGH (ref 3.8–10.6)

## 2017-01-28 LAB — TYPE AND SCREEN
ABO/RH(D): O POS
Antibody Screen: NEGATIVE

## 2017-01-28 LAB — URINALYSIS, COMPLETE (UACMP) WITH MICROSCOPIC
BACTERIA UA: NONE SEEN
Glucose, UA: NEGATIVE mg/dL
HGB URINE DIPSTICK: NEGATIVE
Ketones, ur: 5 mg/dL — AB
Leukocytes, UA: NEGATIVE
NITRITE: NEGATIVE
Protein, ur: 30 mg/dL — AB
SPECIFIC GRAVITY, URINE: 1.026 (ref 1.005–1.030)
pH: 5 (ref 5.0–8.0)

## 2017-01-28 LAB — BASIC METABOLIC PANEL WITH GFR
Anion gap: 8 (ref 5–15)
BUN: 16 mg/dL (ref 6–20)
CO2: 25 mmol/L (ref 22–32)
Calcium: 8.2 mg/dL — ABNORMAL LOW (ref 8.9–10.3)
Chloride: 100 mmol/L — ABNORMAL LOW (ref 101–111)
Creatinine, Ser: 1.05 mg/dL (ref 0.61–1.24)
GFR calc Af Amer: >60 mL/min (ref >60)
GFR calc non Af Amer: >60 mL/min (ref >60)
Glucose, Bld: 184 mg/dL — ABNORMAL HIGH (ref 65–99)
Potassium: 3.6 mmol/L (ref 3.5–5.1)
Sodium: 133 mmol/L — ABNORMAL LOW (ref 135–145)

## 2017-01-28 LAB — POCT I-STAT 4, (NA,K, GLUC, HGB,HCT)
Glucose, Bld: 111 mg/dL — ABNORMAL HIGH (ref 65–99)
HCT: 36 % — ABNORMAL LOW (ref 39.0–52.0)
HEMOGLOBIN: 12.2 g/dL — AB (ref 13.0–17.0)
POTASSIUM: 3.2 mmol/L — AB (ref 3.5–5.1)
SODIUM: 135 mmol/L (ref 135–145)

## 2017-01-28 SURGERY — LAPAROSCOPIC CHOLECYSTECTOMY WITH INTRAOPERATIVE CHOLANGIOGRAM
Anesthesia: General | Site: Abdomen | Wound class: Contaminated

## 2017-01-28 MED ORDER — HYDROMORPHONE HCL 1 MG/ML IJ SOLN
0.5000 mg | INTRAMUSCULAR | Status: DC | PRN
  Administered 2017-01-28: 0.5 mg via INTRAVENOUS
  Filled 2017-01-28: qty 0.5

## 2017-01-28 MED ORDER — SODIUM CHLORIDE 0.9 % IV SOLN
INTRAVENOUS | Status: DC | PRN
  Administered 2017-01-28: 20 ug/min via INTRAVENOUS

## 2017-01-28 MED ORDER — ASPIRIN EC 81 MG PO TBEC
81.0000 mg | DELAYED_RELEASE_TABLET | Freq: Every day | ORAL | Status: DC
  Administered 2017-01-29 – 2017-02-01 (×4): 81 mg via ORAL
  Filled 2017-01-28 (×4): qty 1

## 2017-01-28 MED ORDER — CHLORHEXIDINE GLUCONATE CLOTH 2 % EX PADS
6.0000 | MEDICATED_PAD | Freq: Once | CUTANEOUS | Status: AC
  Administered 2017-01-28: 6 via TOPICAL

## 2017-01-28 MED ORDER — TRAMADOL HCL 50 MG PO TABS
100.0000 mg | ORAL_TABLET | Freq: Four times a day (QID) | ORAL | Status: DC | PRN
  Administered 2017-01-28: 100 mg via ORAL
  Filled 2017-01-28: qty 2

## 2017-01-28 MED ORDER — DIPHENHYDRAMINE HCL 50 MG/ML IJ SOLN: 12.5000 mg | Freq: Four times a day (QID) | INTRAMUSCULAR | Status: DC | PRN

## 2017-01-28 MED ORDER — ONDANSETRON HCL 4 MG/2ML IJ SOLN
INTRAMUSCULAR | Status: AC
  Filled 2017-01-28: qty 2

## 2017-01-28 MED ORDER — SUGAMMADEX SODIUM 200 MG/2ML IV SOLN
INTRAVENOUS | Status: DC | PRN
  Administered 2017-01-28: 150 mg via INTRAVENOUS

## 2017-01-28 MED ORDER — LACTATED RINGERS IV SOLN
INTRAVENOUS | Status: DC
  Administered 2017-01-28 – 2017-01-30 (×6): via INTRAVENOUS

## 2017-01-28 MED ORDER — PROPOFOL 10 MG/ML IV BOLUS
INTRAVENOUS | Status: AC
  Filled 2017-01-28: qty 20

## 2017-01-28 MED ORDER — LABETALOL HCL 5 MG/ML IV SOLN
INTRAVENOUS | Status: AC
  Filled 2017-01-28: qty 4

## 2017-01-28 MED ORDER — KETOROLAC TROMETHAMINE 30 MG/ML IJ SOLN
INTRAMUSCULAR | Status: AC
  Filled 2017-01-28: qty 1

## 2017-01-28 MED ORDER — MIDAZOLAM HCL 2 MG/2ML IJ SOLN
INTRAMUSCULAR | Status: DC | PRN
  Administered 2017-01-28: 2 mg via INTRAVENOUS

## 2017-01-28 MED ORDER — BISOPROLOL-HYDROCHLOROTHIAZIDE 5-6.25 MG PO TABS
1.0000 | ORAL_TABLET | Freq: Every day | ORAL | Status: DC
  Administered 2017-01-29 – 2017-02-01 (×4): 1 via ORAL
  Filled 2017-01-28 (×5): qty 1

## 2017-01-28 MED ORDER — LIDOCAINE HCL (CARDIAC) 20 MG/ML IV SOLN
INTRAVENOUS | Status: DC | PRN
  Administered 2017-01-28: 60 mg via INTRAVENOUS

## 2017-01-28 MED ORDER — HYDROMORPHONE 1 MG/ML IV SOLN
INTRAVENOUS | Status: DC
  Administered 2017-01-28: 18:00:00 via INTRAVENOUS
  Administered 2017-01-28: 2.6 mg via INTRAVENOUS
  Administered 2017-01-29: 1.5 mL via INTRAVENOUS
  Administered 2017-01-29: 1.8 mg via INTRAVENOUS
  Administered 2017-01-29: 1.2 mg via INTRAVENOUS
  Administered 2017-01-29: 1.5 mg via INTRAVENOUS
  Administered 2017-01-29: 0.3 mg via INTRAVENOUS
  Administered 2017-01-29: 1.5 mg via INTRAVENOUS
  Administered 2017-01-30: 0.9 mg via INTRAVENOUS
  Administered 2017-01-30: 0.6 mg via INTRAVENOUS
  Administered 2017-01-30: 0 mg via INTRAVENOUS
  Administered 2017-01-30: 0.6 mg via INTRAVENOUS
  Administered 2017-01-30: 0 mg via INTRAVENOUS
  Filled 2017-01-28 (×2): qty 25

## 2017-01-28 MED ORDER — NALOXONE HCL 0.4 MG/ML IJ SOLN: 0.4000 mg | INTRAMUSCULAR | Status: DC | PRN

## 2017-01-28 MED ORDER — ROCURONIUM BROMIDE 50 MG/5ML IV SOLN
INTRAVENOUS | Status: AC
  Filled 2017-01-28: qty 1

## 2017-01-28 MED ORDER — ACETAMINOPHEN 10 MG/ML IV SOLN
INTRAVENOUS | Status: DC | PRN
  Administered 2017-01-28: 1000 mg via INTRAVENOUS

## 2017-01-28 MED ORDER — IOTHALAMATE MEGLUMINE 60 % INJ SOLN
INTRAMUSCULAR | Status: DC | PRN
  Administered 2017-01-28: 20 mL via SURGICAL_CAVITY

## 2017-01-28 MED ORDER — ACETAMINOPHEN 10 MG/ML IV SOLN
INTRAVENOUS | Status: AC
  Filled 2017-01-28: qty 100

## 2017-01-28 MED ORDER — ALBUMIN HUMAN 5 % IV SOLN
INTRAVENOUS | Status: DC | PRN
  Administered 2017-01-28: 11:00:00 via INTRAVENOUS

## 2017-01-28 MED ORDER — ALBUMIN HUMAN 5 % IV SOLN
INTRAVENOUS | Status: AC
  Filled 2017-01-28: qty 250

## 2017-01-28 MED ORDER — SUCCINYLCHOLINE CHLORIDE 20 MG/ML IJ SOLN
INTRAMUSCULAR | Status: DC | PRN
  Administered 2017-01-28: 80 mg via INTRAVENOUS

## 2017-01-28 MED ORDER — LABETALOL HCL 5 MG/ML IV SOLN
INTRAVENOUS | Status: DC | PRN
  Administered 2017-01-28: 5 mg via INTRAVENOUS

## 2017-01-28 MED ORDER — FENTANYL CITRATE (PF) 100 MCG/2ML IJ SOLN
25.0000 ug | INTRAMUSCULAR | Status: AC | PRN
  Administered 2017-01-28 (×6): 25 ug via INTRAVENOUS

## 2017-01-28 MED ORDER — PROPOFOL 10 MG/ML IV BOLUS
INTRAVENOUS | Status: DC | PRN
  Administered 2017-01-28: 120 mg via INTRAVENOUS

## 2017-01-28 MED ORDER — SODIUM CHLORIDE 0.9% FLUSH: 9.0000 mL | INTRAVENOUS | Status: DC | PRN

## 2017-01-28 MED ORDER — BUPIVACAINE-EPINEPHRINE (PF) 0.25% -1:200000 IJ SOLN
INTRAMUSCULAR | Status: DC | PRN
  Administered 2017-01-28: 30 mL

## 2017-01-28 MED ORDER — ROCURONIUM BROMIDE 100 MG/10ML IV SOLN
INTRAVENOUS | Status: DC | PRN
  Administered 2017-01-28: 10 mg via INTRAVENOUS
  Administered 2017-01-28: 20 mg via INTRAVENOUS
  Administered 2017-01-28: 35 mg via INTRAVENOUS
  Administered 2017-01-28: 5 mg via INTRAVENOUS
  Administered 2017-01-28: 10 mg via INTRAVENOUS

## 2017-01-28 MED ORDER — DEXAMETHASONE SODIUM PHOSPHATE 10 MG/ML IJ SOLN
INTRAMUSCULAR | Status: DC | PRN
  Administered 2017-01-28: 4 mg via INTRAVENOUS

## 2017-01-28 MED ORDER — LACTATED RINGERS IV SOLN
INTRAVENOUS | Status: DC | PRN
  Administered 2017-01-28: 11:00:00 via INTRAVENOUS

## 2017-01-28 MED ORDER — ONDANSETRON HCL 4 MG/2ML IJ SOLN: 4.0000 mg | Freq: Four times a day (QID) | INTRAMUSCULAR | Status: DC | PRN

## 2017-01-28 MED ORDER — MIDAZOLAM HCL 2 MG/2ML IJ SOLN
INTRAMUSCULAR | Status: AC
  Filled 2017-01-28: qty 2

## 2017-01-28 MED ORDER — FENTANYL CITRATE (PF) 100 MCG/2ML IJ SOLN
INTRAMUSCULAR | Status: AC
  Filled 2017-01-28: qty 2

## 2017-01-28 MED ORDER — PANTOPRAZOLE SODIUM 40 MG PO TBEC
40.0000 mg | DELAYED_RELEASE_TABLET | Freq: Every day | ORAL | Status: DC
  Administered 2017-01-29 – 2017-02-01 (×4): 40 mg via ORAL
  Filled 2017-01-28 (×4): qty 1

## 2017-01-28 MED ORDER — LACTATED RINGERS IV SOLN
INTRAVENOUS | Status: DC
  Administered 2017-01-28 (×2): via INTRAVENOUS

## 2017-01-28 MED ORDER — ONDANSETRON HCL 4 MG/2ML IJ SOLN: 4.0000 mg | Freq: Once | INTRAMUSCULAR | Status: DC | PRN

## 2017-01-28 MED ORDER — ONDANSETRON HCL 4 MG/2ML IJ SOLN
INTRAMUSCULAR | Status: DC | PRN
  Administered 2017-01-28: 4 mg via INTRAVENOUS

## 2017-01-28 MED ORDER — FENTANYL CITRATE (PF) 100 MCG/2ML IJ SOLN
INTRAMUSCULAR | Status: DC | PRN
Start: 2017-01-28 — End: 2017-01-28
  Administered 2017-01-28: 50 ug via INTRAVENOUS
  Administered 2017-01-28: 100 ug via INTRAVENOUS

## 2017-01-28 MED ORDER — DIPHENHYDRAMINE HCL 12.5 MG/5ML PO ELIX
12.5000 mg | ORAL_SOLUTION | Freq: Four times a day (QID) | ORAL | Status: DC | PRN
  Filled 2017-01-28: qty 5

## 2017-01-28 MED ORDER — SUGAMMADEX SODIUM 200 MG/2ML IV SOLN
INTRAVENOUS | Status: AC
  Filled 2017-01-28: qty 2

## 2017-01-28 MED ORDER — LIDOCAINE HCL (PF) 2 % IJ SOLN
INTRAMUSCULAR | Status: AC
  Filled 2017-01-28: qty 2

## 2017-01-28 MED ORDER — PHENYLEPHRINE HCL 10 MG/ML IJ SOLN
INTRAMUSCULAR | Status: DC | PRN
  Administered 2017-01-28 (×2): 100 ug via INTRAVENOUS
  Administered 2017-01-28: 50 ug via INTRAVENOUS
  Administered 2017-01-28: 300 ug via INTRAVENOUS

## 2017-01-28 SURGICAL SUPPLY — 49 items
ADHESIVE MASTISOL STRL (MISCELLANEOUS) ×3 IMPLANT
APPLIER CLIP ROT 10 11.4 M/L (STAPLE) ×3
BLADE SURG SZ11 CARB STEEL (BLADE) ×3 IMPLANT
CANISTER SUCT 1200ML W/VALVE (MISCELLANEOUS) ×3 IMPLANT
CATH CHOLANGI 4FR 420404F (CATHETERS) ×3 IMPLANT
CHLORAPREP W/TINT 26ML (MISCELLANEOUS) ×3 IMPLANT
CLIP APPLIE ROT 10 11.4 M/L (STAPLE) ×1 IMPLANT
CLOSURE WOUND 1/2 X4 (GAUZE/BANDAGES/DRESSINGS) ×1
CONRAY 60ML FOR OR (MISCELLANEOUS) ×3 IMPLANT
DRAIN PENROSE 1/4X12 LTX (DRAIN) ×6 IMPLANT
DRAPE C-ARM XRAY 36X54 (DRAPES) ×3 IMPLANT
DRSG OPSITE POSTOP 4X14 (GAUZE/BANDAGES/DRESSINGS) ×3 IMPLANT
ELECT REM PT RETURN 9FT ADLT (ELECTROSURGICAL) ×3
ELECTRODE REM PT RTRN 9FT ADLT (ELECTROSURGICAL) ×1 IMPLANT
ENDOPOUCH RETRIEVER 10 (MISCELLANEOUS) ×3 IMPLANT
GAUZE SPONGE NON-WVN 2X2 STRL (MISCELLANEOUS) ×4 IMPLANT
GLOVE BIO SURGEON STRL SZ8 (GLOVE) ×3 IMPLANT
GOWN STRL REUS W/ TWL LRG LVL3 (GOWN DISPOSABLE) ×4 IMPLANT
GOWN STRL REUS W/TWL LRG LVL3 (GOWN DISPOSABLE) ×8
IRRIGATION STRYKERFLOW (MISCELLANEOUS) ×1 IMPLANT
IRRIGATOR STRYKERFLOW (MISCELLANEOUS) ×3
IV CATH ANGIO 12GX3 LT BLUE (NEEDLE) ×3 IMPLANT
IV NS 1000ML (IV SOLUTION) ×6
IV NS 1000ML BAXH (IV SOLUTION) ×3 IMPLANT
JACKSON PRATT 10 (INSTRUMENTS) ×6 IMPLANT
KIT RM TURNOVER STRD PROC AR (KITS) ×3 IMPLANT
LABEL OR SOLS (LABEL) ×3 IMPLANT
NDL SAFETY 22GX1.5 (NEEDLE) ×3 IMPLANT
NEEDLE VERESS 14GA 120MM (NEEDLE) ×3 IMPLANT
NS IRRIG 500ML POUR BTL (IV SOLUTION) ×3 IMPLANT
PACK LAP CHOLECYSTECTOMY (MISCELLANEOUS) ×3 IMPLANT
SCISSORS METZENBAUM CVD 33 (INSTRUMENTS) ×3 IMPLANT
SLEEVE ENDOPATH XCEL 5M (ENDOMECHANICALS) ×6 IMPLANT
SPONGE LAP 18X18 5 PK (GAUZE/BANDAGES/DRESSINGS) ×9 IMPLANT
SPONGE VERSALON 2X2 STRL (MISCELLANEOUS) ×8
SPONGE VERSALON 4X4 4PLY (MISCELLANEOUS) ×3 IMPLANT
SPONGE XRAY 4X4 16PLY STRL (MISCELLANEOUS) ×3 IMPLANT
STAPLER SKIN PROX 35W (STAPLE) ×3 IMPLANT
STRIP CLOSURE SKIN 1/2X4 (GAUZE/BANDAGES/DRESSINGS) ×2 IMPLANT
SUT ETHILON 3 0 FSLX (SUTURE) ×3 IMPLANT
SUT MNCRL 4-0 (SUTURE) ×2
SUT MNCRL 4-0 27XMFL (SUTURE) ×1
SUT PDS AB 1 TP1 54 (SUTURE) ×6 IMPLANT
SUT VICRYL 0 AB UR-6 (SUTURE) ×3 IMPLANT
SUTURE MNCRL 4-0 27XMF (SUTURE) ×1 IMPLANT
SYR 20CC LL (SYRINGE) ×3 IMPLANT
TROCAR XCEL NON-BLD 11X100MML (ENDOMECHANICALS) ×3 IMPLANT
TROCAR XCEL NON-BLD 5MMX100MML (ENDOMECHANICALS) ×3 IMPLANT
TUBING INSUFFLATOR HI FLOW (MISCELLANEOUS) ×3 IMPLANT

## 2017-01-28 NOTE — Anesthesia Post-op Follow-up Note (Cosign Needed)
Anesthesia QCDR form completed.        

## 2017-01-28 NOTE — Anesthesia Postprocedure Evaluation (Signed)
Anesthesia Post Note  Patient: Luis Marks  Procedure(s) Performed: Procedure(s) (LRB): LAPAROSCOPIC CHOLECYSTECTOMY WITH INTRAOPERATIVE CHOLANGIOGRAM, OPEN ABDOMINAL CHOLECYSTECTOMY (N/A)  Patient location during evaluation: PACU Anesthesia Type: General Level of consciousness: awake and alert and oriented Pain management: pain level controlled Vital Signs Assessment: post-procedure vital signs reviewed and stable Respiratory status: spontaneous breathing Cardiovascular status: blood pressure returned to baseline Anesthetic complications: no     Last Vitals:  Vitals:   01/28/17 1420 01/28/17 1453  BP: 110/72 118/67  Pulse: 83 84  Resp: 15 18  Temp: 36.6 C 36.6 C    Last Pain:  Vitals:   01/28/17 1453  TempSrc: Oral  PainSc:                  Rita Prom

## 2017-01-28 NOTE — Progress Notes (Signed)
CC: Right upper quadrant abdominal pain Subjective: This patient admitted the hospital with right upper quadrant pain and workup showing gallstones with elevated liver function tests. His repeat LFTs have improved.  Today he feels better has less pain and no nausea vomiting fevers or chills he denies jaundice or acholic stools.  Objective: Vital signs in last 24 hours: Temp:  [97.7 F (36.5 C)-98.5 F (36.9 C)] 97.8 F (36.6 C) (06/19 0601) Pulse Rate:  [62-87] 70 (06/19 0601) Resp:  [16-18] 18 (06/19 0601) BP: (113-138)/(68-95) 113/68 (06/19 0601) SpO2:  [92 %-100 %] 97 % (06/19 0601) Weight:  [160 lb 12.8 oz (72.9 kg)-165 lb (74.8 kg)] 160 lb 12.8 oz (72.9 kg) (06/18 2226) Last BM Date: 01/23/17  Intake/Output from previous day: 06/18 0701 - 06/19 0700 In: 868.8 [I.V.:818.8; IV Piggyback:50] Out: 0  Intake/Output this shift: No intake/output data recorded.  Physical exam:  No icterus no jaundice vital signs are stable and afebrile.  Right eye lid abnormality secondary to Bell's palsy  Abdomen is slightly distended tympanitic tender in the right upper quadrant with a positive Murphy sign calves are nontender  Lab Results: CBC   Recent Labs  01/27/17 1819 01/28/17 0422  WBC 19.8* 12.7*  HGB 14.7 13.4  HCT 42.2 39.0*  PLT 168 147*   BMET  Recent Labs  01/27/17 1819 01/28/17 0422  NA 129* 135  K 3.0* 2.9*  CL 95* 97*  CO2 26 30  GLUCOSE 167* 116*  BUN 16 17  CREATININE 1.17 1.22  CALCIUM 9.0 8.6*   PT/INR No results for input(s): LABPROT, INR in the last 72 hours. ABG No results for input(s): PHART, HCO3 in the last 72 hours.  Invalid input(s): PCO2, PO2  Studies/Results: US Abdomen Limited Ruq  Result Date: 01/27/2017 CLINICAL DATA:  RIGHT upper quadrant pain for 4 days, elevated fibular Rubin alkaline phosphatase EXAM: ULTRASOUND ABDOMEN LIMITED RIGHT UPPER QUADRANT COMPARISON:  01/27/2017. FINDINGS: Gallbladder: Significant sludge within  gallbladder new since prior study. Gallbladder wall thickening new since prior study. Probable gallbladder polyp 3 mm diameter though not noted on the previous study. Minimal pericholecystic fluid. No sonographic Murphy sign. Common bile duct: Diameter: 3 mm diameter , normal Liver: Heterogeneous hepatic echogenicity question fatty infiltration, though this can also be seen with cirrhosis and certain infiltrative disorders. No discrete hepatic mass or intrahepatic biliary dilatation. Hepatopetal portal venous flow. No RIGHT upper quadrant free fluid IMPRESSION: Thickened gallbladder wall with large amount of gallbladder sludge new since previous study, though no definite gallstones are identified. Small gallbladder polyp 3 mm diameter. Unable to exclude acute cholecystitis ; patency of the cystic duct could be assessed by followup hepatobiliary imaging. Probable fatty infiltration of liver as above. Electronically Signed   By: Ulyses Southward M.D.   On: 01/27/2017 19:29    Anti-infectives: Anti-infectives    Start     Dose/Rate Route Frequency Ordered Stop   01/28/17 0330  piperacillin-tazobactam (ZOSYN) IVPB 3.375 g     3.375 g 12.5 mL/hr over 240 Minutes Intravenous Every 8 hours 01/27/17 2052     01/27/17 1930  piperacillin-tazobactam (ZOSYN) IVPB 3.375 g     3.375 g 100 mL/hr over 30 Minutes Intravenous  Once 01/27/17 1920 01/27/17 2100      Assessment/Plan:  Potassium replacement in progress. LFTs improved over last night. Discussed with Dr. Earlene Plater who is the admitting physician. I also discussed patient with Dr. Noralyn Pick of anesthesia.  I recommended laparoscopic cholecystectomy with cholangiography the rationale for this  been discussed the options of observation reviewed the risk bleeding infection conversion to an open procedure open common bile duct exploration bile duct damage bile duct leak ERCP postoperatively were all discussed with him he understood and agreed to proceed questions were  answered for them.  Lattie Hawichard E Rozell Kettlewell, MD, FACS  01/28/2017

## 2017-01-28 NOTE — Anesthesia Procedure Notes (Signed)
Procedure Name: Intubation Performed by: Lance Muss Pre-anesthesia Checklist: Patient identified, Patient being monitored, Timeout performed, Emergency Drugs available and Suction available Patient Re-evaluated:Patient Re-evaluated prior to inductionOxygen Delivery Method: Circle system utilized Preoxygenation: Pre-oxygenation with 100% oxygen Intubation Type: IV induction Ventilation: Mask ventilation without difficulty Laryngoscope Size: Mac and 3 Grade View: Grade II Tube type: Oral Tube size: 7.5 mm Number of attempts: 1 Airway Equipment and Method: Stylet Placement Confirmation: ETT inserted through vocal cords under direct vision,  positive ETCO2 and breath sounds checked- equal and bilateral Secured at: 22 cm Tube secured with: Tape Dental Injury: Teeth and Oropharynx as per pre-operative assessment

## 2017-01-28 NOTE — Anesthesia Preprocedure Evaluation (Addendum)
Anesthesia Evaluation  Patient identified by MRN, date of birth, ID band Patient awake    Reviewed: Allergy & Precautions, NPO status , Patient's Chart, lab work & pertinent test results  Airway Mallampati: II  TM Distance: >3 FB     Dental  (+) Poor Dentition, Missing, Chipped   Pulmonary neg pulmonary ROS,    Pulmonary exam normal        Cardiovascular hypertension, Pt. on medications Normal cardiovascular exam     Neuro/Psych  Neuromuscular disease    GI/Hepatic negative GI ROS, Neg liver ROS,   Endo/Other  negative endocrine ROS  Renal/GU      Musculoskeletal negative musculoskeletal ROS (+)   Abdominal Normal abdominal exam  (+)   Peds  Hematology negative hematology ROS (+)   Anesthesia Other Findings Past Medical History: No date: Bell's palsy No date: Hypertension No date: Renal disorder  Reproductive/Obstetrics                           Anesthesia Physical Anesthesia Plan  ASA: II  Anesthesia Plan: General   Post-op Pain Management:    Induction: Intravenous  PONV Risk Score and Plan: 3 and Ondansetron, Dexamethasone, Propofol and Midazolam  Airway Management Planned: Oral ETT  Additional Equipment:   Intra-op Plan:   Post-operative Plan: Extubation in OR  Informed Consent: I have reviewed the patients History and Physical, chart, labs and discussed the procedure including the risks, benefits and alternatives for the proposed anesthesia with the patient or authorized representative who has indicated his/her understanding and acceptance.   Dental advisory given  Plan Discussed with:   Anesthesia Plan Comments:         Anesthesia Quick Evaluation

## 2017-01-28 NOTE — Progress Notes (Signed)
Dr. Excell Seltzerooper notified of pain 9 out of 10 with max limit on PCA reached. MD order to give dilauded 0.5 mg Q3 PRN for breakthrough pain.

## 2017-01-28 NOTE — Op Note (Signed)
Laparoscopic Cholecystectomy  Pre-operative Diagnosis: Acute Cholecystitis  Post-operative Diagnosis: Acute cholecystitis with empyema of the gallbladder  Procedure: Laparoscopic cholecystectomy with C-arm fluoroscopic cholangiography, converted to open procedure  Surgeon: Adah Salvage. Excell Seltzer, MD FACS  Anesthesia: Gen. with endotracheal tube  Assistant: Westley Gambles  Procedure Details  The patient was seen again in the Holding Room. The benefits, complications, treatment options, and expected outcomes were discussed with the patient. The risks of bleeding, infection, recurrence of symptoms, failure to resolve symptoms, bile duct damage, bile duct leak, retained common bile duct stone, bowel injury, any of which could require further surgery and/or ERCP, stent, or papillotomy were reviewed with the patient. The likelihood of improving the patient's symptoms with return to their baseline status is good.  The patient and/or family concurred with the proposed plan, giving informed consent.  The patient was taken to Operating Room, identified as Luis Marks and the procedure verified as Laparoscopic Cholecystectomy.  A Time Out was held and the above information confirmed.  Prior to the induction of general anesthesia, antibiotic prophylaxis was administered. VTE prophylaxis was in place. General endotracheal anesthesia was then administered and tolerated well. After the induction, the abdomen was prepped with Chloraprep and draped in the sterile fashion. The patient was positioned in the supine position.  Local anesthetic  was injected into the skin near the umbilicus and an incision made. The Veress needle was placed. Pneumoperitoneum was then created with CO2 and tolerated well without any adverse changes in the patient's vital signs. A 5mm port was placed in the periumbilical position and the abdominal cavity was explored.  Two 5-mm ports were placed in the right upper quadrant and a 12 mm  epigastric port was placed all under direct vision. All skin incisions  were infiltrated with a local anesthetic agent before making the incision and placing the trocars.   The patient was positioned  in reverse Trendelenburg, tilted slightly to the patient's left.  The gallbladder was found to be encased in a thick omental cake. This omental cake was retracted caudad for visualization of the gallbladder. The gallbladder was identified, the fundus grasped and retracted cephalad. The gallbladder was found to be greatly thickened and edematous hyperemic and in some places frankly gangrenous. Adhesions were lysed bluntly. The infundibulum was grasped and retracted laterally, exposing the peritoneum overlying the triangle of Calot. This was then divided and exposed in a blunt fashion. A critical view of the cystic duct and cystic artery was obtained.  The cystic duct was clearly identified and bluntly dissected.   The cystic duct was clipped and incised and through a separate incision and Angiocath a cholangiogram catheter was placed into the abdominal cavity. The lumen of the cystic duct was very small and it was difficult to advance the catheter into the cystic duct and multiple attempts were made to do this in this very small lumen and ultimately cholangiography was obtained. There was a leak around the cystic duct catheter but both distal and proximal ducts could be identified and the cystic duct had been cannulated. There were no intraluminal filling defects but there was the sense that there could be some sludge in the distal common bile duct.  The cystic duct catheter was removed and the cystic duct was doubly clipped and divided. Dissection around the back of the cystic duct resulted in unexpected brisk venous bleeding. It was nonpulsatile but was quite brisk and of sufficient quantity that it was determined immediately that conversion to an open procedure  would be required.  A Coker type incision was  performed opening the abdominal cavity and immediately placing laps into the area of the porta hepatis to control the hemorrhage. In order to obtain better visualization this Kocher incision was enlarged on the midline cephalad.   Realizing that a vascular injury was present and attempt at obtaining an assistant was initiated. There was no vascular surgeon available in no general surgeon available either therefore at this point Dr. Thelma Bargeaks kindly came in to assist.  Adequate exposure was obtained and a Pringle maneuver was performed to aid in identification of the location of the hemorrhage. The clips that had been placed were all removed while the Pringle maneuver was in place and then vascular clamps were placed gently over the hepatoduodenal ligament. Courses of the hepatic edema duodenal ligament were dissected out to ensure that the common duct was intact as it had been with the cholangiogram. The cystic duct stump was well identified. The source of bleeding ultimately proved to be not the portal vein or the hepatic artery but likely the cystic artery at the confluence of the common bile duct and the cystic duct. In this area cephalad to the common bile duct common hepatic duct and cystic duct juncture brisk bleeding was identified when the vascular clamps were released. Figure-of-eight 40 Prolenes were placed to control this hemorrhage. 2 sutures were placed and adequate control of the hemorrhage was obtained. This was checked multiple times.  The cystic duct was well identified and doubly clipped.  The gallbladder was taken from the gallbladder fossa in a prograde fashion with the electrocautery. The gallbladder was removed .Marland Kitchen. The liver bed was irrigated and inspected. Hemostasis was achieved with the electrocautery. Copious irrigation was utilized and was repeatedly aspirated until clear.    2 separate drains were placed through's small incisions in the right lateral abdomen. One was placed into the  lateral hepatic space. The second was placed into the foramen of Winslow. Both were tied in with 3-0 nylon and both were 10 mm JP drains.  At this point irrigation was performed again inspection of the area of repair was performed and there was no sign of bleeding. There was no sign of bile staining or bile leak.  The small bowel and colon were inspected as well to ensure that no injury had occurred during the rapid Coker incision.  Inspection of the right upper quadrant was performed. No bleeding, bile duct injury or leak, or bowel injury was noted.  Once assuring that the sponge lap needle count was correct multiple times the wound was closed with running #1 PDS in a single layer and skin staples were placed. A total of 30 cc of Marcaine had been used. Sterile dressing was placed and the drains were placed to bulb suction.    The patient was then extubated and brought to the recovery room in stable condition. Sponge, lap, and needle counts were correct at closure and at the conclusion of the case.   Findings: Acute Cholecystitis with empyema  Estimated Blood Loss: 500ml         Drains: 2 drains, JP 10mm         Specimens: Gallbladder           Complications: none               Dent Plantz E. Excell Seltzerooper, MD, FACS

## 2017-01-28 NOTE — Progress Notes (Signed)
Preoperative Review   Patient is met in the preoperative holding area. The history is reviewed in the chart and with the patient. I personally reviewed the options and rationale as well as the risks of this procedure that have been previously discussed with the patient. All questions asked by the patient and/or family were answered to their satisfaction.  Patient agrees to proceed with this procedure at this time.  Draylon Mercadel E Lataya Varnell M.D. FACS  

## 2017-01-28 NOTE — Transfer of Care (Signed)
Immediate Anesthesia Transfer of Care Note  Patient: Shirlee LatchRonald Wentling  Procedure(s) Performed: Procedure(s): LAPAROSCOPIC CHOLECYSTECTOMY WITH INTRAOPERATIVE CHOLANGIOGRAM, OPEN ABDOMINAL CHOLECYSTECTOMY (N/A)  Patient Location: PACU  Anesthesia Type:General  Level of Consciousness: awake and responds to stimulation  Airway & Oxygen Therapy: Patient Spontanous Breathing and Patient connected to face mask oxygen  Post-op Assessment: Report given to RN and Post -op Vital signs reviewed and stable  Post vital signs: Reviewed and stable  Last Vitals:  Vitals:   01/28/17 1248 01/28/17 1250  BP: 129/73 129/73  Pulse: (!) 45 84  Resp: 20 20  Temp: 36.1 C     Last Pain:  Vitals:   01/28/17 1248  TempSrc:   PainSc: 0-No pain         Complications: No apparent anesthesia complications

## 2017-01-29 ENCOUNTER — Encounter: Payer: Self-pay | Admitting: Surgery

## 2017-01-29 LAB — CBC WITH DIFFERENTIAL/PLATELET
BASOS PCT: 0 %
Basophils Absolute: 0 10*3/uL (ref 0–0.1)
EOS ABS: 0 10*3/uL (ref 0–0.7)
Eosinophils Relative: 0 %
HEMATOCRIT: 28.2 % — AB (ref 40.0–52.0)
HEMOGLOBIN: 9.8 g/dL — AB (ref 13.0–18.0)
Lymphocytes Relative: 4 %
Lymphs Abs: 0.6 10*3/uL — ABNORMAL LOW (ref 1.0–3.6)
MCH: 31.2 pg (ref 26.0–34.0)
MCHC: 34.6 g/dL (ref 32.0–36.0)
MCV: 89.9 fL (ref 80.0–100.0)
MONOS PCT: 9 %
Monocytes Absolute: 1.3 10*3/uL — ABNORMAL HIGH (ref 0.2–1.0)
NEUTROS ABS: 12.7 10*3/uL — AB (ref 1.4–6.5)
NEUTROS PCT: 87 %
Platelets: 165 10*3/uL (ref 150–440)
RBC: 3.14 MIL/uL — AB (ref 4.40–5.90)
RDW: 12.7 % (ref 11.5–14.5)
WBC: 14.6 10*3/uL — AB (ref 3.8–10.6)

## 2017-01-29 LAB — COMPREHENSIVE METABOLIC PANEL
ALT: 72 U/L — ABNORMAL HIGH (ref 17–63)
AST: 70 U/L — ABNORMAL HIGH (ref 15–41)
Albumin: 2.5 g/dL — ABNORMAL LOW (ref 3.5–5.0)
Alkaline Phosphatase: 89 U/L (ref 38–126)
Anion gap: 5 (ref 5–15)
BUN: 12 mg/dL (ref 6–20)
CHLORIDE: 100 mmol/L — AB (ref 101–111)
CO2: 30 mmol/L (ref 22–32)
Calcium: 8.1 mg/dL — ABNORMAL LOW (ref 8.9–10.3)
Creatinine, Ser: 0.92 mg/dL (ref 0.61–1.24)
Glucose, Bld: 120 mg/dL — ABNORMAL HIGH (ref 65–99)
POTASSIUM: 4.2 mmol/L (ref 3.5–5.1)
Sodium: 135 mmol/L (ref 135–145)
Total Bilirubin: 2.2 mg/dL — ABNORMAL HIGH (ref 0.3–1.2)
Total Protein: 5.9 g/dL — ABNORMAL LOW (ref 6.5–8.1)

## 2017-01-29 LAB — SURGICAL PATHOLOGY

## 2017-01-29 LAB — HIV ANTIBODY (ROUTINE TESTING W REFLEX): HIV Screen 4th Generation wRfx: NONREACTIVE

## 2017-01-29 NOTE — Progress Notes (Signed)
1 Day Post-Op  Subjective: Pain is better controlled today. He feels well at this point no nausea or vomiting and is tolerating clear liquid diet he does not have much appetite.  Objective: Vital signs in last 24 hours: Temp:  [96.4 F (35.8 C)-98 F (36.7 C)] 97.7 F (36.5 C) (06/20 0401) Pulse Rate:  [45-90] 90 (06/20 0401) Resp:  [12-30] 12 (06/20 0752) BP: (104-140)/(63-95) 119/85 (06/20 0401) SpO2:  [85 %-100 %] 94 % (06/20 0810) FiO2 (%):  [94 %-98 %] 98 % (06/20 0752) Last BM Date: 01/23/17  Intake/Output from previous day: 06/19 0701 - 06/20 0700 In: 5139.6 [P.O.:120; I.V.:4669.6; IV Piggyback:350] Out: 1155 [Urine:425; Drains:160; Blood:500] Intake/Output this shift: Total I/O In: -  Out: 75 [Drains:75]  Physical exam:  No bile in drain serosanguineous fluid in drain. Leakage around the drain sites. No icterus no jaundice vital signs are stable  Lab Results: CBC   Recent Labs  01/28/17 1337 01/29/17 0625  WBC 15.7* 14.6*  HGB 11.4* 9.8*  HCT 33.3* 28.2*  PLT 183 165   BMET  Recent Labs  01/28/17 1337 01/29/17 0625  NA 133* 135  K 3.6 4.2  CL 100* 100*  CO2 25 30  GLUCOSE 184* 120*  BUN 16 12  CREATININE 1.05 0.92  CALCIUM 8.2* 8.1*   PT/INR No results for input(s): LABPROT, INR in the last 72 hours. ABG No results for input(s): PHART, HCO3 in the last 72 hours.  Invalid input(s): PCO2, PO2  Studies/Results: Dg Cholangiogram Operative  Result Date: 01/28/2017 CLINICAL DATA:  Acute cholecystitis. EXAM: INTRAOPERATIVE CHOLANGIOGRAM TECHNIQUE: Cholangiographic images from the C-arm fluoroscopic device were submitted for interpretation post-operatively. Please see the procedural report for the amount of contrast and the fluoroscopy time utilized. COMPARISON:  Ultrasound 01/28/2015 FINDINGS: Contrast opacification of the extrahepatic biliary system. The distal common bile duct is patent and there is contrast draining into the duodenum. There is a  large amount of contrast extravasation near the cannulation site and limited evaluation of this area. Cannot exclude nonobstructive linear sludge in the proximal common bile duct. IMPRESSION: Common bile duct is patent without obstructing lesions. Cannot exclude nonobstructive sludge in the common bile duct. Large amount of extravasation near the cannulation site. Electronically Signed   By: Richarda Overlie M.D.   On: 01/28/2017 11:04   US Abdomen Limited Ruq  Result Date: 01/27/2017 CLINICAL DATA:  RIGHT upper quadrant pain for 4 days, elevated fibular Rubin alkaline phosphatase EXAM: ULTRASOUND ABDOMEN LIMITED RIGHT UPPER QUADRANT COMPARISON:  01/27/2017. FINDINGS: Gallbladder: Significant sludge within gallbladder new since prior study. Gallbladder wall thickening new since prior study. Probable gallbladder polyp 3 mm diameter though not noted on the previous study. Minimal pericholecystic fluid. No sonographic Murphy sign. Common bile duct: Diameter: 3 mm diameter , normal Liver: Heterogeneous hepatic echogenicity question fatty infiltration, though this can also be seen with cirrhosis and certain infiltrative disorders. No discrete hepatic mass or intrahepatic biliary dilatation. Hepatopetal portal venous flow. No RIGHT upper quadrant free fluid IMPRESSION: Thickened gallbladder wall with large amount of gallbladder sludge new since previous study, though no definite gallstones are identified. Small gallbladder polyp 3 mm diameter. Unable to exclude acute cholecystitis ; patency of the cystic duct could be assessed by followup hepatobiliary imaging. Probable fatty infiltration of liver as above. Electronically Signed   By: Ulyses Southward M.D.   On: 01/27/2017 19:29    Anti-infectives: Anti-infectives    Start     Dose/Rate Route Frequency Ordered Stop  01/28/17 0330  piperacillin-tazobactam (ZOSYN) IVPB 3.375 g     3.375 g 12.5 mL/hr over 240 Minutes Intravenous Every 8 hours 01/27/17 2052     01/27/17  1930  piperacillin-tazobactam (ZOSYN) IVPB 3.375 g     3.375 g 100 mL/hr over 30 Minutes Intravenous  Once 01/27/17 1920 01/27/17 2100      Assessment/Plan: s/p Procedure(s): LAPAROSCOPIC CHOLECYSTECTOMY WITH INTRAOPERATIVE CHOLANGIOGRAM, OPEN ABDOMINAL CHOLECYSTECTOMY   Labs are reviewed slightly elevated LFTs. Patient doing very well no sign of bile leak. We'll advance diet today  Lattie Hawichard E Avelyn Touch, MD, Carilyn GoodpastureFACS  01/29/2017

## 2017-01-30 LAB — CBC WITH DIFFERENTIAL/PLATELET
BASOS ABS: 0 10*3/uL (ref 0–0.1)
BASOS PCT: 0 %
EOS ABS: 0.2 10*3/uL (ref 0–0.7)
EOS PCT: 2 %
HCT: 26.5 % — ABNORMAL LOW (ref 40.0–52.0)
Hemoglobin: 9.1 g/dL — ABNORMAL LOW (ref 13.0–18.0)
LYMPHS PCT: 10 %
Lymphs Abs: 1.2 10*3/uL (ref 1.0–3.6)
MCH: 30.6 pg (ref 26.0–34.0)
MCHC: 34.1 g/dL (ref 32.0–36.0)
MCV: 89.6 fL (ref 80.0–100.0)
MONO ABS: 1.4 10*3/uL — AB (ref 0.2–1.0)
Monocytes Relative: 11 %
Neutro Abs: 10.1 10*3/uL — ABNORMAL HIGH (ref 1.4–6.5)
Neutrophils Relative %: 77 %
PLATELETS: 162 10*3/uL (ref 150–440)
RBC: 2.96 MIL/uL — AB (ref 4.40–5.90)
RDW: 12.9 % (ref 11.5–14.5)
WBC: 13 10*3/uL — AB (ref 3.8–10.6)

## 2017-01-30 LAB — COMPREHENSIVE METABOLIC PANEL
ALBUMIN: 2.6 g/dL — AB (ref 3.5–5.0)
ALT: 53 U/L (ref 17–63)
AST: 39 U/L (ref 15–41)
Alkaline Phosphatase: 89 U/L (ref 38–126)
Anion gap: 4 — ABNORMAL LOW (ref 5–15)
BUN: 10 mg/dL (ref 6–20)
CHLORIDE: 97 mmol/L — AB (ref 101–111)
CO2: 34 mmol/L — AB (ref 22–32)
CREATININE: 1 mg/dL (ref 0.61–1.24)
Calcium: 8.4 mg/dL — ABNORMAL LOW (ref 8.9–10.3)
GFR calc Af Amer: >60 mL/min (ref >60)
Glucose, Bld: 109 mg/dL — ABNORMAL HIGH (ref 65–99)
POTASSIUM: 3.7 mmol/L (ref 3.5–5.1)
SODIUM: 135 mmol/L (ref 135–145)
Total Bilirubin: 1.6 mg/dL — ABNORMAL HIGH (ref 0.3–1.2)
Total Protein: 6 g/dL — ABNORMAL LOW (ref 6.5–8.1)

## 2017-01-30 NOTE — Progress Notes (Addendum)
Pt requesting to have PCA discontinued as he wants to get up and walk without all the tubing. Per MD okay to discontinue PCA and Saline lock IV.

## 2017-01-30 NOTE — Progress Notes (Signed)
2 Days Post-Op  Subjective: Patient feels a little better today still not much appetite but tolerated Jell-O and juices last night. No nausea or vomiting is using his PCA pump with good relief.  Objective: Vital signs in last 24 hours: Temp:  [97.7 F (36.5 C)-98.4 F (36.9 C)] 97.7 F (36.5 C) (06/21 0737) Pulse Rate:  [82-92] 92 (06/21 0737) Resp:  [13-20] 18 (06/21 0737) BP: (123-147)/(73-90) 147/90 (06/21 0737) SpO2:  [90 %-100 %] 99 % (06/21 0737) FiO2 (%):  [90 %-95 %] 90 % (06/20 1730) Last BM Date: 01/23/17  Intake/Output from previous day: 06/20 0701 - 06/21 0700 In: 2507 [P.O.:40; I.V.:2367; IV Piggyback:100] Out: 865 [Urine:700; Drains:165] Intake/Output this shift: Total I/O In: 293 [I.V.:252; IV Piggyback:41] Out: 375 [Urine:375]  Physical exam:  Vital signs are stable and afebrile. No bile in drain is serosanguineous only no frank blood. Wound is clean without erythema or drainage staples are intact  Lab Results: CBC   Recent Labs  01/29/17 0625 01/30/17 0419  WBC 14.6* 13.0*  HGB 9.8* 9.1*  HCT 28.2* 26.5*  PLT 165 162   BMET  Recent Labs  01/29/17 0625 01/30/17 0419  NA 135 135  K 4.2 3.7  CL 100* 97*  CO2 30 34*  GLUCOSE 120* 109*  BUN 12 10  CREATININE 0.92 1.00  CALCIUM 8.1* 8.4*   PT/INR No results for input(s): LABPROT, INR in the last 72 hours. ABG No results for input(s): PHART, HCO3 in the last 72 hours.  Invalid input(s): PCO2, PO2  Studies/Results: Dg Cholangiogram Operative  Result Date: 01/28/2017 CLINICAL DATA:  Acute cholecystitis. EXAM: INTRAOPERATIVE CHOLANGIOGRAM TECHNIQUE: Cholangiographic images from the C-arm fluoroscopic device were submitted for interpretation post-operatively. Please see the procedural report for the amount of contrast and the fluoroscopy time utilized. COMPARISON:  Ultrasound 01/28/2015 FINDINGS: Contrast opacification of the extrahepatic biliary system. The distal common bile duct is patent  and there is contrast draining into the duodenum. There is a large amount of contrast extravasation near the cannulation site and limited evaluation of this area. Cannot exclude nonobstructive linear sludge in the proximal common bile duct. IMPRESSION: Common bile duct is patent without obstructing lesions. Cannot exclude nonobstructive sludge in the common bile duct. Large amount of extravasation near the cannulation site. Electronically Signed   By: Richarda OverlieAdam  Henn M.D.   On: 01/28/2017 11:04    Anti-infectives: Anti-infectives    Start     Dose/Rate Route Frequency Ordered Stop   01/28/17 0330  piperacillin-tazobactam (ZOSYN) IVPB 3.375 g     3.375 g 12.5 mL/hr over 240 Minutes Intravenous Every 8 hours 01/27/17 2052     01/27/17 1930  piperacillin-tazobactam (ZOSYN) IVPB 3.375 g     3.375 g 100 mL/hr over 30 Minutes Intravenous  Once 01/27/17 1920 01/27/17 2100      Assessment/Plan: s/p Procedure(s): LAPAROSCOPIC CHOLECYSTECTOMY WITH INTRAOPERATIVE CHOLANGIOGRAM, OPEN ABDOMINAL CHOLECYSTECTOMY   H&H noted LFTs normalized. Patient doing quite well at this time we will advance diet and continue to observe hematocrit. Will decrease IV fluids as well. Possibly advance diet today  Lattie Hawichard E Rusty Glodowski, MD, Carilyn GoodpastureFACS  01/30/2017

## 2017-01-31 LAB — CBC WITH DIFFERENTIAL/PLATELET
BASOS PCT: 1 %
Basophils Absolute: 0.1 10*3/uL (ref 0–0.1)
EOS ABS: 0.3 10*3/uL (ref 0–0.7)
EOS PCT: 3 %
HCT: 24.5 % — ABNORMAL LOW (ref 40.0–52.0)
Hemoglobin: 8.6 g/dL — ABNORMAL LOW (ref 13.0–18.0)
LYMPHS ABS: 1.1 10*3/uL (ref 1.0–3.6)
Lymphocytes Relative: 10 %
MCH: 31.2 pg (ref 26.0–34.0)
MCHC: 35.1 g/dL (ref 32.0–36.0)
MCV: 89 fL (ref 80.0–100.0)
Monocytes Absolute: 1 10*3/uL (ref 0.2–1.0)
Monocytes Relative: 9 %
NEUTROS PCT: 77 %
Neutro Abs: 8.6 10*3/uL — ABNORMAL HIGH (ref 1.4–6.5)
PLATELETS: 182 10*3/uL (ref 150–440)
RBC: 2.75 MIL/uL — ABNORMAL LOW (ref 4.40–5.90)
RDW: 13.1 % (ref 11.5–14.5)
WBC: 11.1 10*3/uL — ABNORMAL HIGH (ref 3.8–10.6)

## 2017-01-31 MED ORDER — TRAMADOL HCL 50 MG PO TABS: 100.0000 mg | ORAL_TABLET | Freq: Four times a day (QID) | ORAL | 0 refills | Status: DC | PRN

## 2017-01-31 MED ORDER — FERROUS SULFATE 325 (65 FE) MG PO TABS
325.0000 mg | ORAL_TABLET | Freq: Two times a day (BID) | ORAL | Status: DC
  Administered 2017-01-31 – 2017-02-01 (×3): 325 mg via ORAL
  Filled 2017-01-31 (×3): qty 1

## 2017-01-31 MED ORDER — FERROUS SULFATE 325 (65 FE) MG PO TABS: 325.0000 mg | ORAL_TABLET | Freq: Two times a day (BID) | ORAL | 1 refills | Status: AC

## 2017-01-31 MED ORDER — AMOXICILLIN-POT CLAVULANATE 875-125 MG PO TABS: 1.0000 | ORAL_TABLET | Freq: Two times a day (BID) | ORAL | 1 refills | Status: DC

## 2017-01-31 NOTE — Progress Notes (Signed)
3 Days Post-Op  Subjective: Patient has PCA discontinued last night. He has no pain this morning. He is tolerating a diet. He asked about going home sometime today.  Objective: Vital signs in last 24 hours: Temp:  [98.1 F (36.7 C)-98.4 F (36.9 C)] 98.2 F (36.8 C) (06/22 0603) Pulse Rate:  [82-93] 84 (06/22 0603) Resp:  [17-25] 18 (06/22 0603) BP: (109-129)/(66-79) 109/67 (06/22 0603) SpO2:  [93 %-98 %] 94 % (06/22 0603) Last BM Date: 01/23/17  Intake/Output from previous day: 06/21 0701 - 06/22 0700 In: 597 [P.O.:60; I.V.:402; IV Piggyback:135] Out: 815 [Urine:775; Drains:40] Intake/Output this shift: Total I/O In: 21 [IV Piggyback:21] Out: 0   Physical exam:  Vital signs are stable and afebrile. Patient appears comfortable. Abdomen is soft and nontender there is serosanguineous fluid in both drains but no frank blood. Wound is clean with no erythema or drainage. Calves are nontender no icterus no jaundice  Lab Results: CBC   Recent Labs  01/30/17 0419 01/31/17 0410  WBC 13.0* 11.1*  HGB 9.1* 8.6*  HCT 26.5* 24.5*  PLT 162 182   BMET  Recent Labs  01/29/17 0625 01/30/17 0419  NA 135 135  K 4.2 3.7  CL 100* 97*  CO2 30 34*  GLUCOSE 120* 109*  BUN 12 10  CREATININE 0.92 1.00  CALCIUM 8.1* 8.4*   PT/INR No results for input(s): LABPROT, INR in the last 72 hours. ABG No results for input(s): PHART, HCO3 in the last 72 hours.  Invalid input(s): PCO2, PO2  Studies/Results: No results found.  Anti-infectives: Anti-infectives    Start     Dose/Rate Route Frequency Ordered Stop   01/28/17 0330  piperacillin-tazobactam (ZOSYN) IVPB 3.375 g     3.375 g 12.5 mL/hr over 240 Minutes Intravenous Every 8 hours 01/27/17 2052     01/27/17 1930  piperacillin-tazobactam (ZOSYN) IVPB 3.375 g     3.375 g 100 mL/hr over 30 Minutes Intravenous  Once 01/27/17 1920 01/27/17 2100      Assessment/Plan: s/p Procedure(s): LAPAROSCOPIC CHOLECYSTECTOMY WITH  INTRAOPERATIVE CHOLANGIOGRAM, OPEN ABDOMINAL CHOLECYSTECTOMY   Blood cell count remains slightly elevated and hemoglobin has not yet stabilized although there is no sign of bleeding and his drains etc. he has a drain in the foramen of Winslow and one in the right lateral gutter/perihepatic space. We'll continue IV antibiotics and start FeSO4 at this time will recheck CBC in the morning likely be able to discharge tomorrow.  Lattie Hawichard E Cooper, MD, FACS  01/31/2017

## 2017-02-01 LAB — CBC WITH DIFFERENTIAL/PLATELET
BASOS PCT: 1 %
Basophils Absolute: 0.1 10*3/uL (ref 0–0.1)
EOS ABS: 0.5 10*3/uL (ref 0–0.7)
Eosinophils Relative: 4 %
HCT: 27.1 % — ABNORMAL LOW (ref 40.0–52.0)
Hemoglobin: 9.6 g/dL — ABNORMAL LOW (ref 13.0–18.0)
LYMPHS ABS: 1.2 10*3/uL (ref 1.0–3.6)
Lymphocytes Relative: 10 %
MCH: 31.4 pg (ref 26.0–34.0)
MCHC: 35.5 g/dL (ref 32.0–36.0)
MCV: 88.3 fL (ref 80.0–100.0)
MONOS PCT: 10 %
Monocytes Absolute: 1.2 10*3/uL — ABNORMAL HIGH (ref 0.2–1.0)
NEUTROS PCT: 75 %
Neutro Abs: 9.3 10*3/uL — ABNORMAL HIGH (ref 1.4–6.5)
PLATELETS: 259 10*3/uL (ref 150–440)
RBC: 3.07 MIL/uL — ABNORMAL LOW (ref 4.40–5.90)
RDW: 12.7 % (ref 11.5–14.5)
WBC: 12.2 10*3/uL — ABNORMAL HIGH (ref 3.8–10.6)

## 2017-02-01 NOTE — Progress Notes (Signed)
Pt A and O x 4. VSS. Pt tolerating diet well. No complaints of pain or nausea. IV removed intact, prescriptions given. JP drains removed by MD this AM. Pt voiced understanding of discharge instructions with no further questions. Pt discharged via wheelchair with nurse aide.

## 2017-02-01 NOTE — Discharge Instructions (Signed)
Remove dressing in 24 hours. °May shower in 24 hours. °Leave staples in place. °Resume all home medications. °Follow-up with Dr. Ilo Beamon in 10 days. °

## 2017-02-01 NOTE — Progress Notes (Signed)
4 Days Post-Op  Subjective: Patient feels well today no dizziness no abdominal pain no nausea vomiting no fevers or chills and is tolerating a diet albeit his appetite oh height is not  Objective: Vital signs in last 24 hours: Temp:  [97.9 F (36.6 C)-98.6 F (37 C)] 98.6 F (37 C) (06/23 0548) Pulse Rate:  [71-86] 84 (06/23 0548) Resp:  [17-19] 17 (06/23 0548) BP: (114-132)/(71-78) 116/76 (06/23 0548) SpO2:  [92 %-97 %] 93 % (06/23 0548) Last BM Date: 01/23/17  Intake/Output from previous day: 06/22 0701 - 06/23 0700 In: 376 [P.O.:240; IV Piggyback:136] Out: 422 [Urine:401; Drains:21] Intake/Output this shift: No intake/output data recorded.  Physical exam:  No icterus no jaundice abdomen is soft nondistended nontympanitic and nontender wound is clean without erythema or drainage. No bile in drain.  Drains are removed.  Lab Results: CBC   Recent Labs  01/31/17 0410 02/01/17 0300  WBC 11.1* 12.2*  HGB 8.6* 9.6*  HCT 24.5* 27.1*  PLT 182 259   BMET  Recent Labs  01/30/17 0419  NA 135  K 3.7  CL 97*  CO2 34*  GLUCOSE 109*  BUN 10  CREATININE 1.00  CALCIUM 8.4*   PT/INR No results for input(s): LABPROT, INR in the last 72 hours. ABG No results for input(s): PHART, HCO3 in the last 72 hours.  Invalid input(s): PCO2, PO2  Studies/Results: No results found.  Anti-infectives: Anti-infectives    Start     Dose/Rate Route Frequency Ordered Stop   01/31/17 0000  amoxicillin-clavulanate (AUGMENTIN) 875-125 MG tablet     1 tablet Oral 2 times daily 01/31/17 1019     01/28/17 0330  piperacillin-tazobactam (ZOSYN) IVPB 3.375 g     3.375 g 12.5 mL/hr over 240 Minutes Intravenous Every 8 hours 01/27/17 2052     01/27/17 1930  piperacillin-tazobactam (ZOSYN) IVPB 3.375 g     3.375 g 100 mL/hr over 30 Minutes Intravenous  Once 01/27/17 1920 01/27/17 2100      Assessment/Plan: s/p Procedure(s): LAPAROSCOPIC CHOLECYSTECTOMY WITH INTRAOPERATIVE  CHOLANGIOGRAM, OPEN ABDOMINAL CHOLECYSTECTOMY   Labs are reviewed H&H is stable. Patient is currently on iron. We'll discharge today to follow-up in my office midweek. Questions were answered for him. He may shower.  Lattie Hawichard E Lorena Benham, MD, FACS  02/01/2017

## 2017-02-01 NOTE — Discharge Summary (Signed)
Physician Discharge Summary  Patient ID: Luis Marks MRN: [id number] DOB/AGE: 1951-01-25 66 y.o.  Admit date: 01/27/2017 Discharge date: 02/01/2017   Discharge Diagnoses:  Active Problems:   Acute cholecystitis   Procedures:Laparoscopic cholecystectomy converted to open cholecystectomy for intraoperative bleeding. Cholangiograms of been performed as well.  Hospital Course: This patient admitted the hospital with signs of acute cholecystitis. He was taken the operating room where laparoscopic cholecystectomy was performed. At the end of the procedure after this clinic gram had been performed and the cystic duct had been clipped bleeding was encountered which was quite brisk necessitating an open procedure conversion. Bleeding was controlled with Prolene sutures as well as clips. It ended out that this was the cystic artery that was bleeding briskly due to the hyperemic acute cholecystitis. Pathology confirmed that this was acute necrotizing cholecystitis.  Postoperatively the patient did well he is tolerating a regular diet is discharged stable condition with instructions to remove his dressings were his drains had been removed and shower tomorrow. He will follow-up in my office midweek.  Consults: None  Disposition: 01-Home or Self Care   Allergies as of 02/01/2017      Reactions   Vicodin [hydrocodone-acetaminophen] Other (See Comments)      Medication List    TAKE these medications   amoxicillin-clavulanate 875-125 MG tablet Commonly known as:  AUGMENTIN Take 1 tablet by mouth 2 (two) times daily.   aspirin EC 81 MG tablet Take 81 mg by mouth daily.   bisoprolol-hydrochlorothiazide 5-6.25 MG tablet Commonly known as:  ZIAC Take 1 tablet by mouth daily.   ferrous sulfate 325 (65 FE) MG tablet Take 1 tablet (325 mg total) by mouth 2 (two) times daily with a meal.   pantoprazole 40 MG tablet Commonly known as:  PROTONIX Take 40 mg by mouth daily.   traMADol 50 MG  tablet Commonly known as:  ULTRAM Take 2 tablets (100 mg total) by mouth every 6 (six) hours as needed for moderate pain. What changed:  reasons to take this      Follow-up Information    Lattie Hawooper, Jacqueli Pangallo E, MD Follow up in 7 day(s).   Specialty:  Surgery Contact information: [address] [place] [id number] [phone]           Lattie Hawichard E Cletus Paris, MD, FACS

## 2017-02-03 ENCOUNTER — Telehealth: Payer: Self-pay | Admitting: General Practice

## 2017-02-03 NOTE — Telephone Encounter (Signed)
Patient's FMLA Form was filled out. Called patient's daughter Victorino Dike(Jennifer) to let her know that it has been left at the front desk for pick up as well as the Disability Certificate Letter that was requested.

## 2017-02-03 NOTE — Telephone Encounter (Signed)
Patients daughter called said she would be dropping fmla paperwork off to be filled out, Victorino DikeJennifer came by and dropped off fmla paperwork and $25.00 fee has been paid, paperwork is in your folder up front.please call daughter when paperwork is ready.

## 2017-02-07 ENCOUNTER — Encounter: Payer: Self-pay | Admitting: Surgery

## 2017-02-07 ENCOUNTER — Ambulatory Visit (INDEPENDENT_AMBULATORY_CARE_PROVIDER_SITE_OTHER): Payer: BLUE CROSS/BLUE SHIELD | Admitting: Surgery

## 2017-02-07 VITALS — BP 108/69 | HR 73 | Temp 98.3°F | Wt 158.0 lb

## 2017-02-07 DIAGNOSIS — K810 Acute cholecystitis: Secondary | ICD-10-CM

## 2017-02-07 NOTE — Patient Instructions (Signed)
Please call our office with any questions or concerns.  Please do not submerge in a tub, hot tub, or pool until incisions are completely sealed.  Use sun block to incision area over the next year if this area will be exposed to sun. This helps decrease scarring.  You may resume your normal activities on 03/11/2017. At that time- Listen to your body when lifting, if you have pain when lifting, stop and then try again in a few days. Pain after doing exercises or activities of daily living is normal as you get back in to your normal routine.  If you develop redness, drainage, or pain at incision sites- call our office immediately and speak with a nurse.

## 2017-02-07 NOTE — Progress Notes (Signed)
Outpatient postop visit  02/07/2017  Luis Marks is an 66 y.o. male.    Procedure: Laparoscopic cholecystectomy with cholangiography  ZO:[id number]:loose stools  HPI: This patient underwent a laparoscopic cholecystectomy with cholangiography. This was converted to an open procedure near the end of the procedure when brisk bleeding was encountered. This a patient who had experienced loose stools while on Augmentin and he stop them on his own volition 2 days ago his loose stools have resolved. He also quite well at this point is increasing his appetite and getting around quite well he had questions about going back to work. His loose stools of resolved. He has no fevers or chills Medications reviewed.    Physical Exam:  BP 108/69   Pulse 73   Temp 98.3 F (36.8 C) (Oral)   Wt 158 lb (71.7 kg)   BMI 24.02 kg/m     PE: No icterus no jaundice vital signs are reviewed. Abdomen is soft nontender staples are in place half are removed and Steri-Strips are placed there is no erythema drainage or ecchymosis.    Assessment/Plan:  Pathology is reviewed. Patient doing very well recommend follow-up next week to remove the remainder of the staples. He should not return to his regular job that requires heavy lifting for a total of 4 weeks from the time of surgery.  Lattie Hawichard E Rebecka Oelkers, MD, FACS

## 2017-02-13 ENCOUNTER — Encounter: Payer: Self-pay | Admitting: General Surgery

## 2017-02-13 ENCOUNTER — Ambulatory Visit (INDEPENDENT_AMBULATORY_CARE_PROVIDER_SITE_OTHER): Payer: BLUE CROSS/BLUE SHIELD | Admitting: General Surgery

## 2017-02-13 VITALS — BP 118/82 | HR 70 | Temp 98.0°F | Ht 68.0 in | Wt 152.0 lb

## 2017-02-13 DIAGNOSIS — Z4889 Encounter for other specified surgical aftercare: Secondary | ICD-10-CM

## 2017-02-13 NOTE — Patient Instructions (Signed)
We will see you in the office as listed below:   If you have any questions or concerns prior to that appointment please give our office a call.

## 2017-02-14 NOTE — Progress Notes (Signed)
Outpatient Surgical Follow Up  02/14/2017  Luis Marks is an 66 y.o. male.   Chief Complaint  Patient presents with  . Routine Post Op    LAPAROSCOPIC CHOLECYSTECTOMY WITH INTRAOPERATIVE CHOLANGIOGRAM, OPEN ABDOMINAL CHOLECYSTECTOMY 01/28/17 Dr. Excell Seltzerooper    HPI: 66 year old male returns to clinic 3 weeks s/p laparoscopic converted to open cholecystectomy. States his pain has been fairly well controlled. His appetite has been gradually improving and he has been having normal bowel function lately. Denies any fevers, chills, chest pain or shortness of breath. Still has some staples in place.  Past Medical History:  Diagnosis Date  . Bell's palsy   . Hypertension   . Renal disorder     Past Surgical History:  Procedure Laterality Date  . CHOLECYSTECTOMY N/A 01/28/2017   Procedure: LAPAROSCOPIC CHOLECYSTECTOMY WITH INTRAOPERATIVE CHOLANGIOGRAM, OPEN ABDOMINAL CHOLECYSTECTOMY;  Surgeon: Lattie Hawooper, Richard E, MD;  Location: [facility];  Service: General;  Laterality: N/A;    History reviewed. No pertinent family history.  Social History:  reports that he has never smoked. He has never used smokeless tobacco. He reports that he does not drink alcohol or use drugs.  Allergies:  Allergies  Allergen Reactions  . Vicodin [Hydrocodone-Acetaminophen] Other (See Comments)    Medications reviewed.    ROS A multipoint ROS was completed, all pertinent positives and negatives are documented in the HPI and the remainder are negatives   BP 118/82   Pulse 70   Temp 98 F (36.7 C) (Oral)   Ht 5\' 8"  (1.727 m)   Wt 68.9 kg (152 lb)   BMI 23.11 kg/m   Physical Exam Gen: NAD Resp: CTA CV: RRR Abd: Soft, appropriately TTP along his incision sites and nondistended. Combination of steristrips and staples in place with resolving eccymosis. No evidence of drainage or infection.    No results found for this or any previous visit (from the past 48 hour(s)). No results  found.  Assessment/Plan:  1. Aftercare following surgery 66 year old male s/p open cholecystectomy. Staples removed today. Discussed wound care instructions and signs of infection. He will return to clinic in 1 week for additional evaluation by his operative surgeon prior to being released from clinic.     Luis Frameharles Gor Vestal, MD FACS General Surgeon  02/14/2017,9:12 AM

## 2017-02-17 ENCOUNTER — Ambulatory Visit (INDEPENDENT_AMBULATORY_CARE_PROVIDER_SITE_OTHER): Payer: BLUE CROSS/BLUE SHIELD | Admitting: Surgery

## 2017-02-17 ENCOUNTER — Encounter: Payer: Self-pay | Admitting: Surgery

## 2017-02-17 VITALS — BP 133/76 | HR 65 | Temp 97.7°F | Ht 68.0 in | Wt 154.0 lb

## 2017-02-17 DIAGNOSIS — K810 Acute cholecystitis: Secondary | ICD-10-CM

## 2017-02-17 NOTE — Progress Notes (Signed)
Outpatient postop visit  02/17/2017  Luis Marks is an 66 y.o. male.    Procedure: Laparoscopic cholecystectomy with cholangiography and conversion to open procedure.  CC: Feels well  HPI: This patient underwent a conversion to an open procedure for bleeding. He had acute cholecystitis at the time. Currently he is doing well all of the staples had been removed and he feels back to normal. He has no dizziness and is completing a course of iron pills.  Medications reviewed.    Physical Exam:  BP 133/76   Pulse 65   Temp 97.7 F (36.5 C) (Oral)   Ht 5\' 8"  (1.727 m)   Wt 154 lb (69.9 kg)   BMI 23.42 kg/m     PE: Wound is clean no erythema no drainage Steri-Strips are in place staples are removed at his last visit.    Assessment/Plan:  Patient doing very well recommend follow up on an as-needed basis.  Lattie Hawichard E Zayvien Canning, MD, FACS

## 2017-02-17 NOTE — Patient Instructions (Signed)
Please continue the iron until gone. Please call our office if you have any questions or concerns.

## 2017-03-05 ENCOUNTER — Telehealth: Payer: Self-pay

## 2017-03-05 NOTE — Telephone Encounter (Signed)
Patient's job is requiring a return to work letter to be faxed. You can fax this letter to [employer]([place] nurse [place] at [employer] # [phone].

## 2017-03-06 NOTE — Telephone Encounter (Signed)
Letter faxed Attn: Velna HatchetSheila at this time to [phone] with positive confirmation.

## 2018-10-25 IMAGING — US US ABDOMEN LIMITED
1 series · 14 of 25 positions shown · non-contrast
Comparison: 01/27/2017.

CLINICAL DATA: RIGHT upper quadrant pain for 4 days, elevated
fibular Tiger alkaline phosphatase

EXAM:
ULTRASOUND ABDOMEN LIMITED RIGHT UPPER QUADRANT

[Series 1: us abdomen limited · 0.25mm/px · 14 of 68 slices shown]
[im 1/68]
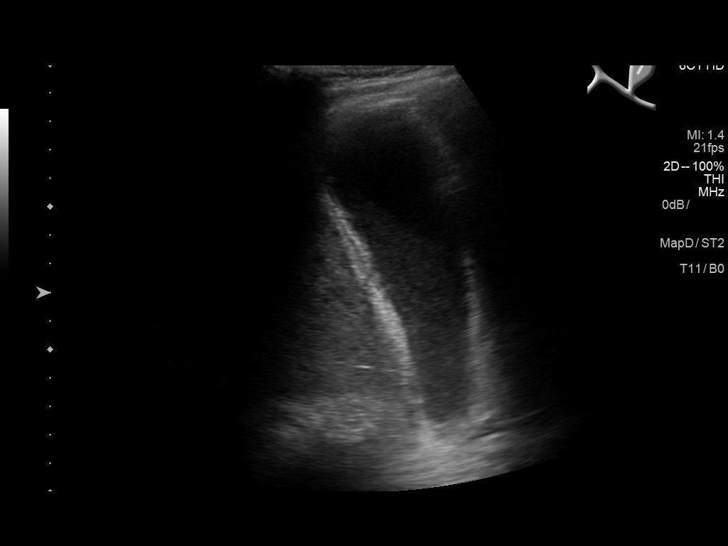
[im 6/68]
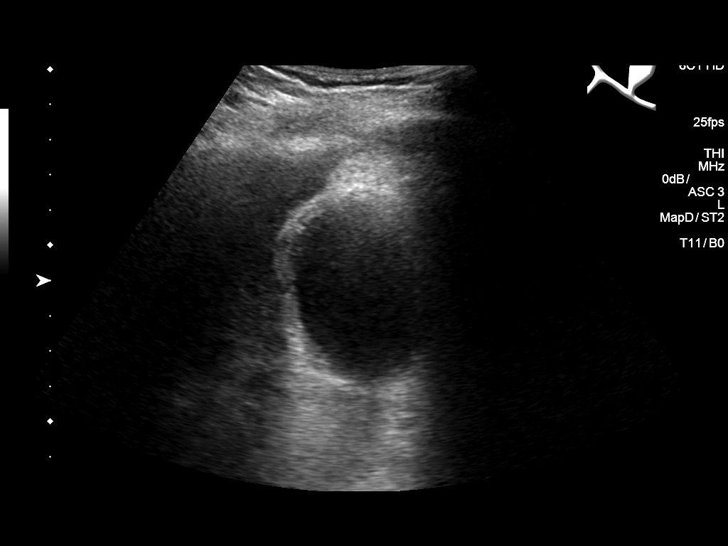
[im 12/68]
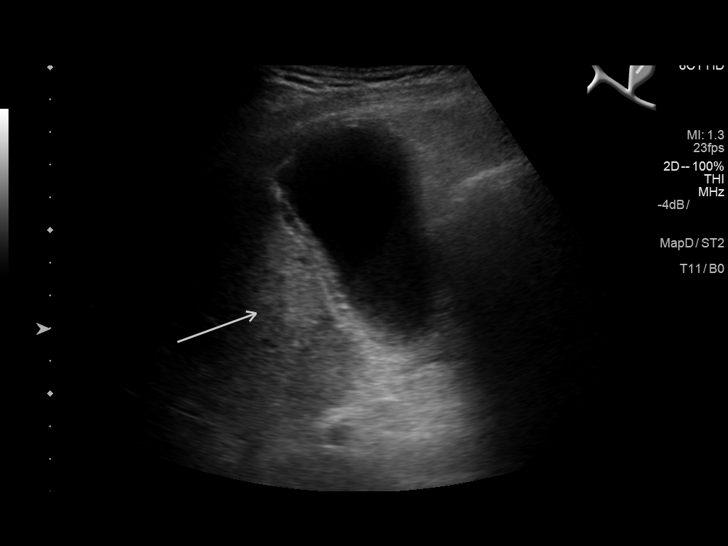
[im 17/68]
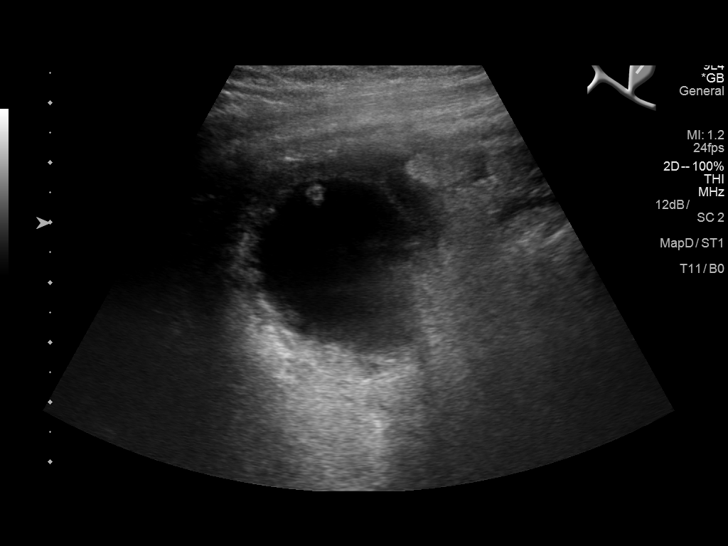
[im 23/68]
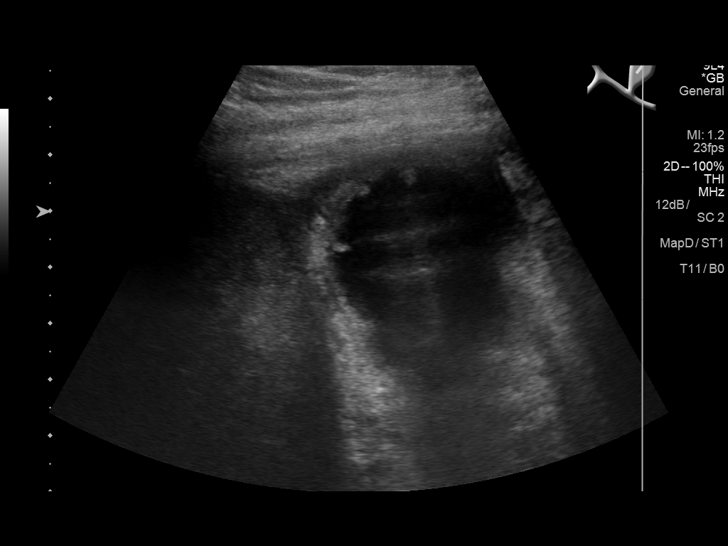
[im 26/68]
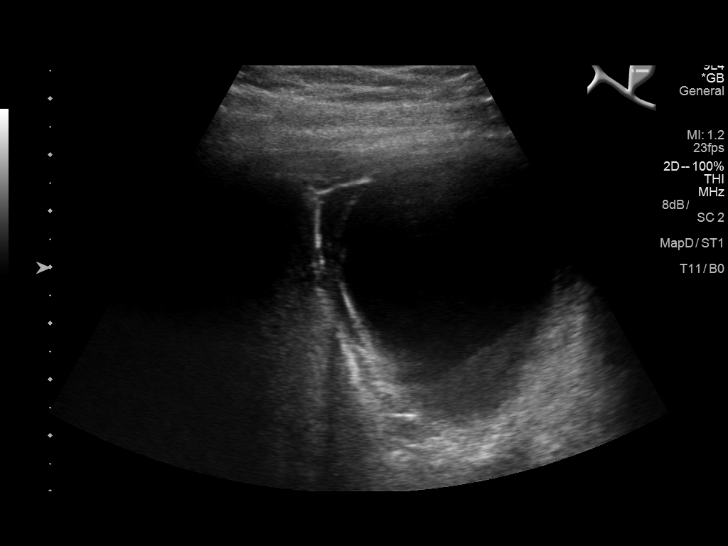
[im 31/68]
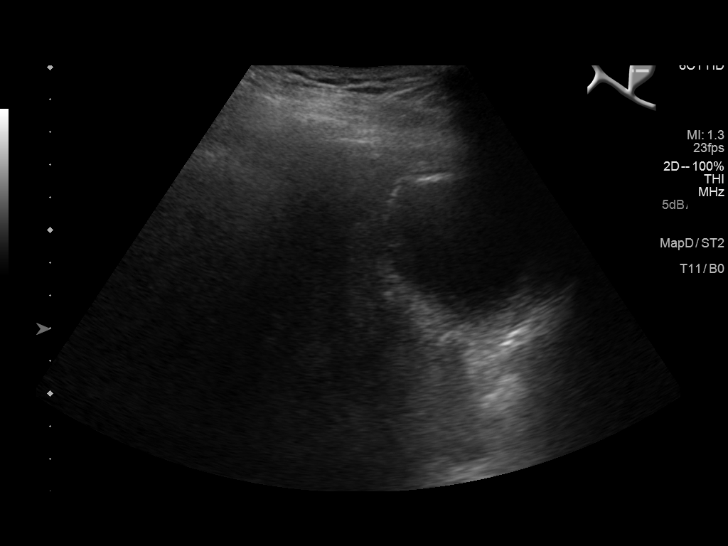
[im 37/68]
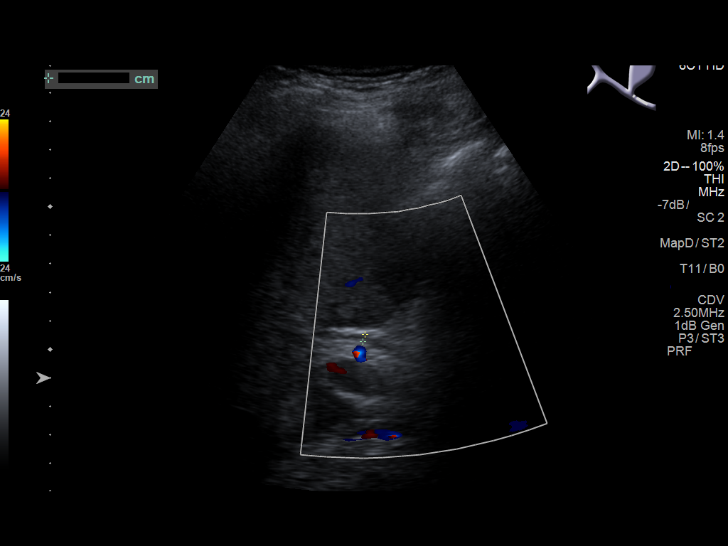
[im 42/68]
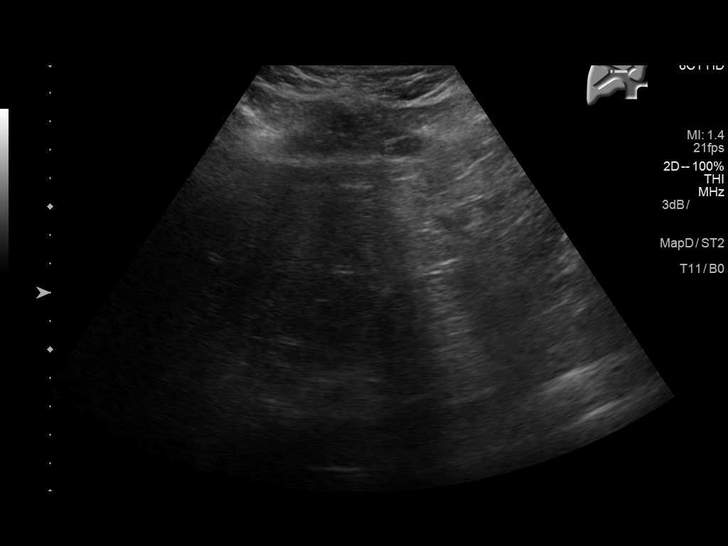
[im 45/68]
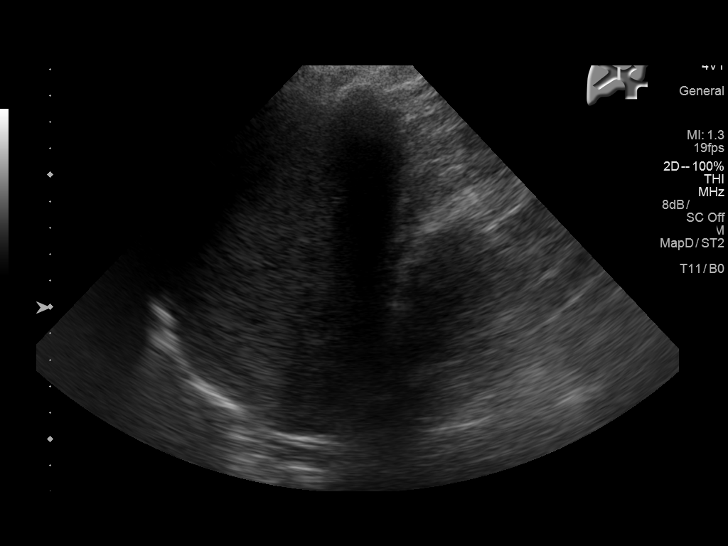
[im 51/68]
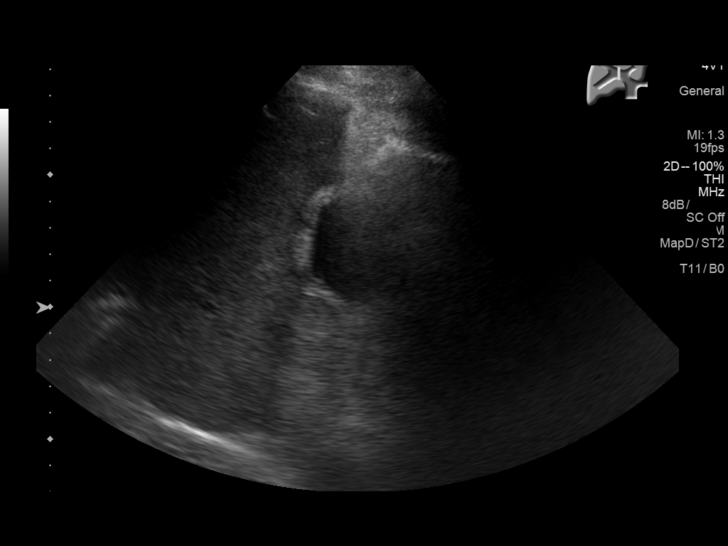
[im 56/68]
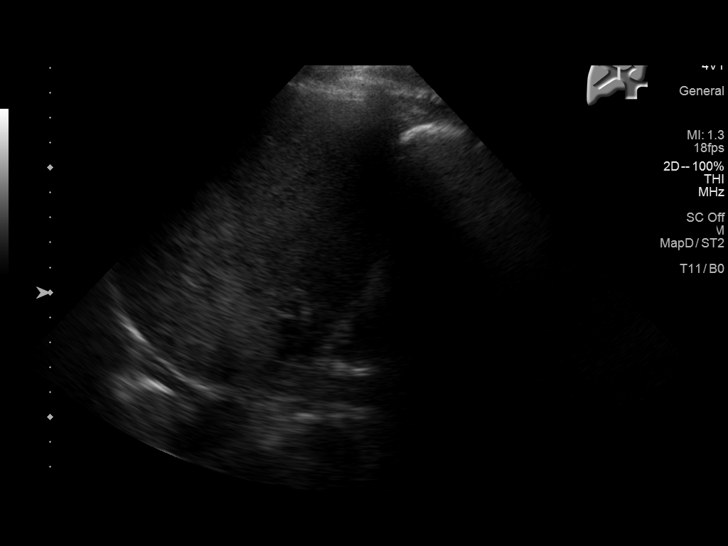
[im 62/68]
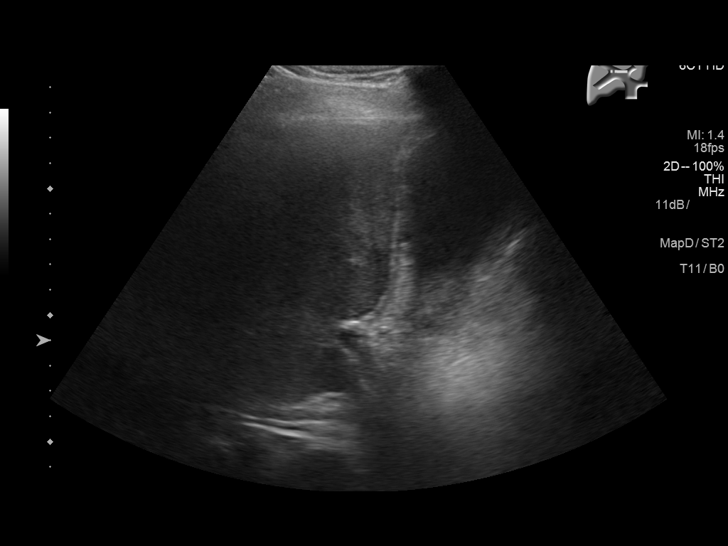
[im 68/68]
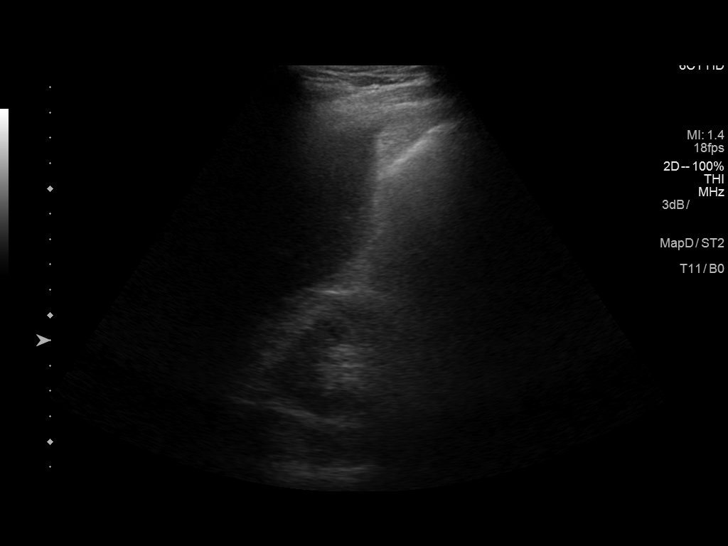

[14 of 25 positions shown; findings below may reference images not displayed]

FINDINGS: Gallbladder:

Significant sludge within gallbladder new since prior study.
Gallbladder wall thickening new since prior study. Probable
gallbladder polyp 3 mm diameter though not noted on the previous
study. Minimal pericholecystic fluid. No sonographic Murphy sign.

Common bile duct:

Diameter: 3 mm diameter , normal

Liver:

Heterogeneous hepatic echogenicity question fatty infiltration,
though this can also be seen with cirrhosis and certain infiltrative
disorders. No discrete hepatic mass or intrahepatic biliary
dilatation. Hepatopetal portal venous flow.

No RIGHT upper quadrant free fluid
IMPRESSION: Thickened gallbladder wall with large amount of gallbladder sludge
new since previous study, though no definite gallstones are
identified.

Small gallbladder polyp 3 mm diameter.

Unable to exclude acute cholecystitis ; patency of the cystic duct
could be assessed by followup hepatobiliary imaging.

Probable fatty infiltration of liver as above.

## 2019-03-01 IMAGING — CR DG CHOLANGIOGRAM OPERATIVE
4 series · 14 of 34 positions shown · non-contrast
Comparison: Ultrasound 01/28/2015

CLINICAL DATA: Acute cholecystitis.

EXAM:
INTRAOPERATIVE CHOLANGIOGRAM
TECHNIQUE: Cholangiographic images from the C-arm fluoroscopic device were
submitted for interpretation post-operatively. Please see the
procedural report for the amount of contrast and the fluoroscopy
time utilized.

[Series 4: cont. · 4 of 62 frames shown (1 of 4)]
[frame 7/62]
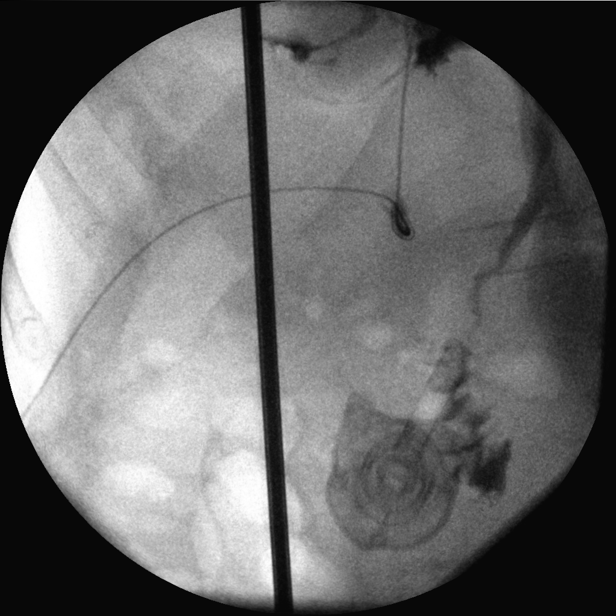
[frame 21/62]
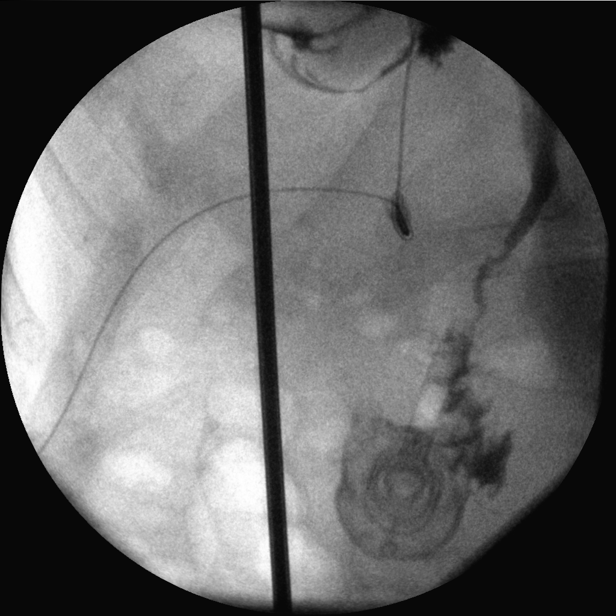
[frame 41/62]
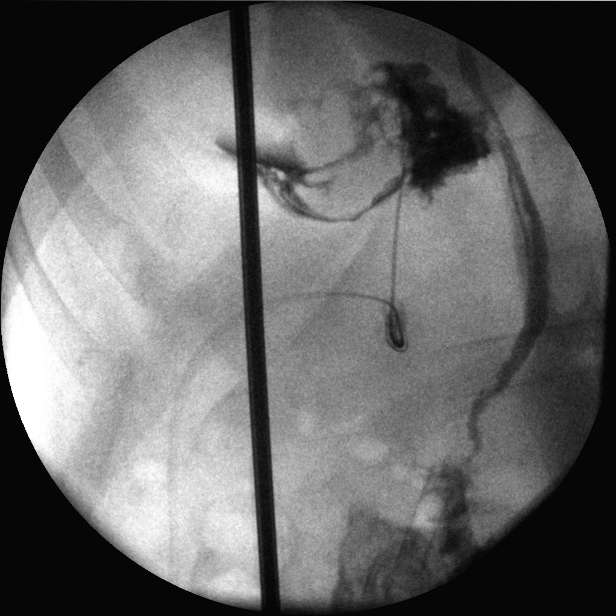
[frame 55/62]
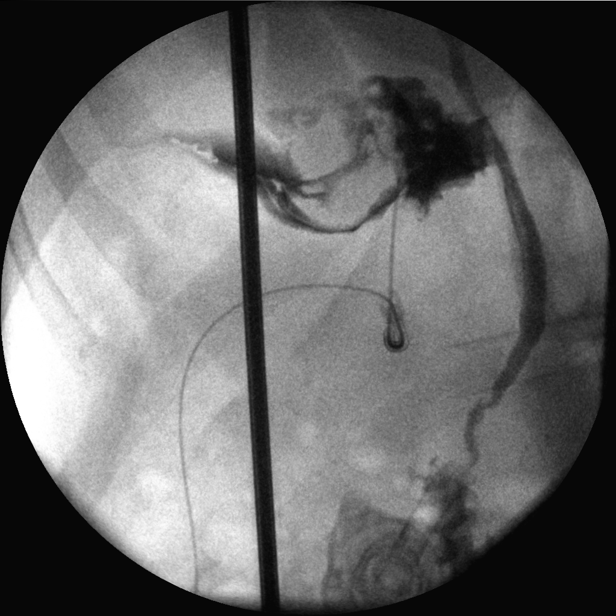

[Series 5: cont. · 4 of 21 frames shown (2 of 4)]
[frame 1/21]
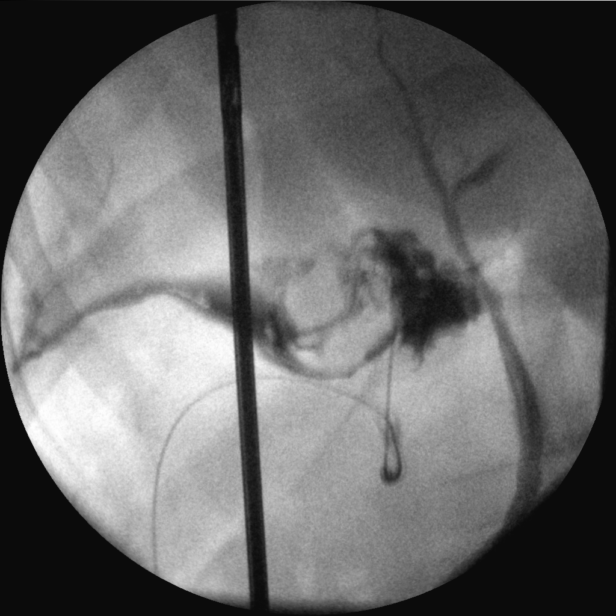
[frame 7/21]
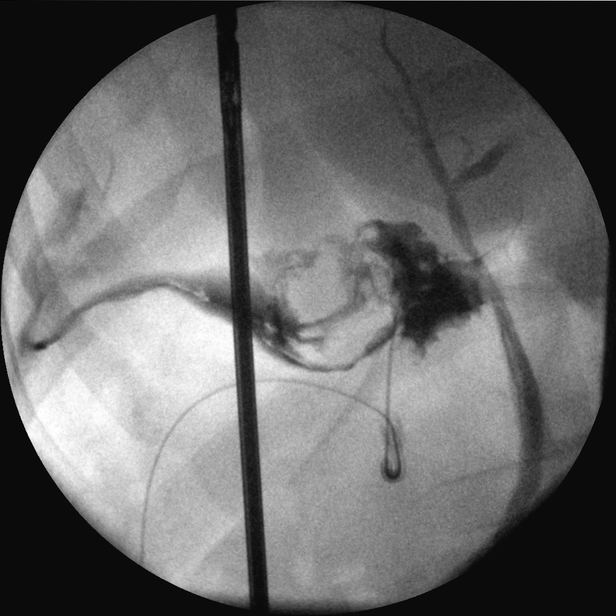
[frame 12/21]
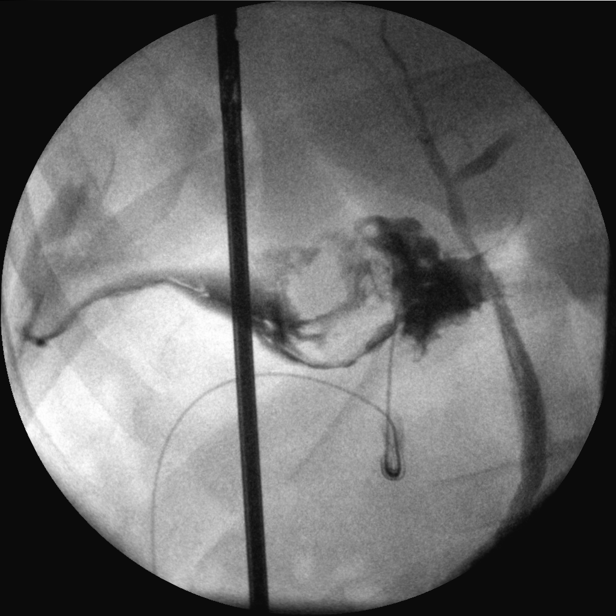
[frame 18/21]
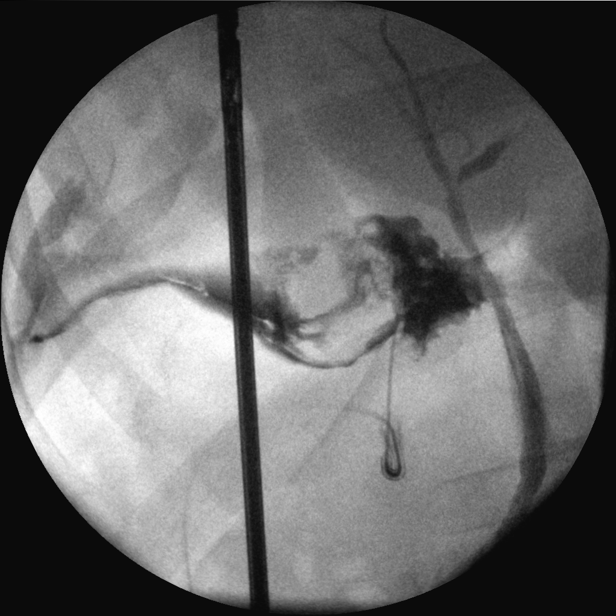

[Series 6: cont. · 2 of 4 frames shown (3 of 4)]
[frame 1/4]
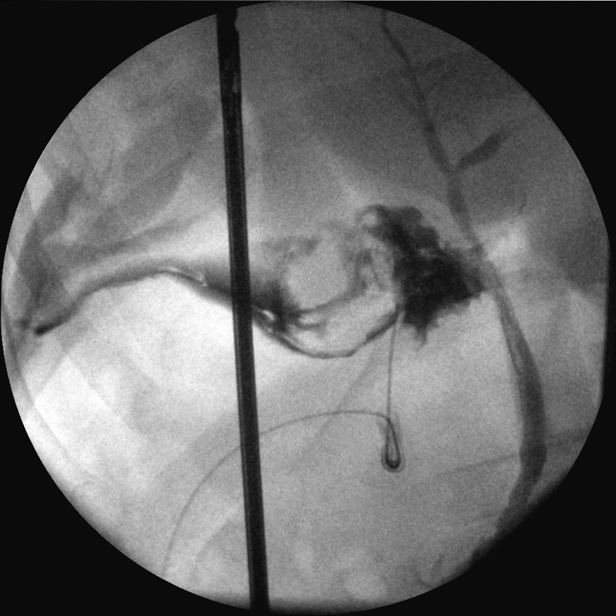
[frame 4/4]
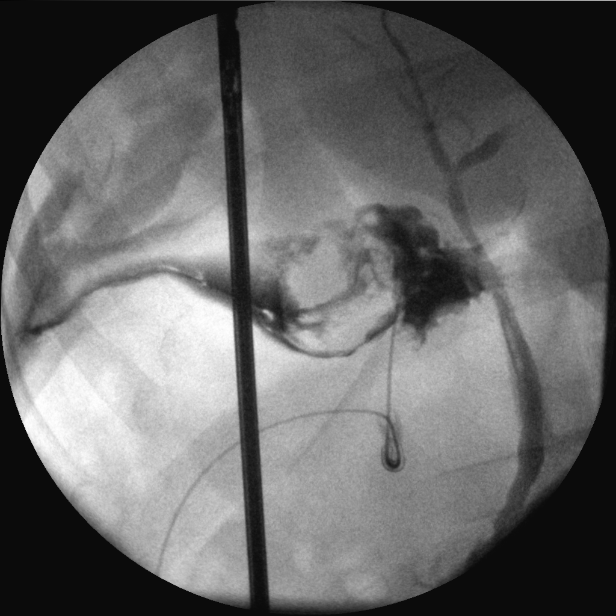

[Series 7: cont. · 4 of 28 frames shown (4 of 4)]
[frame 4/28]
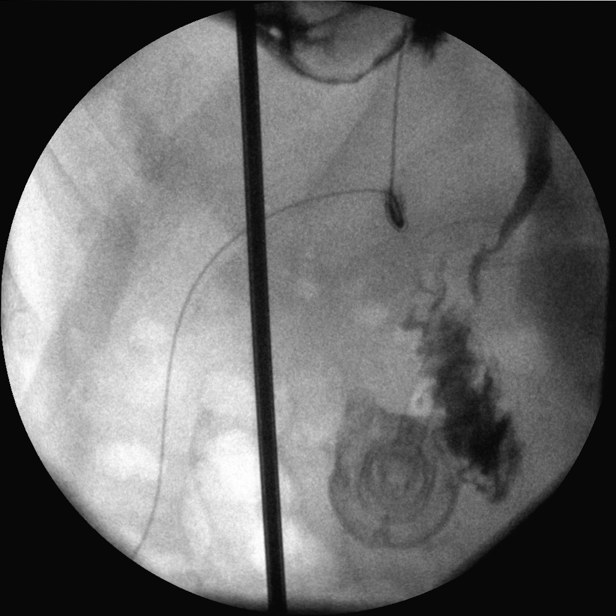
[frame 10/28]
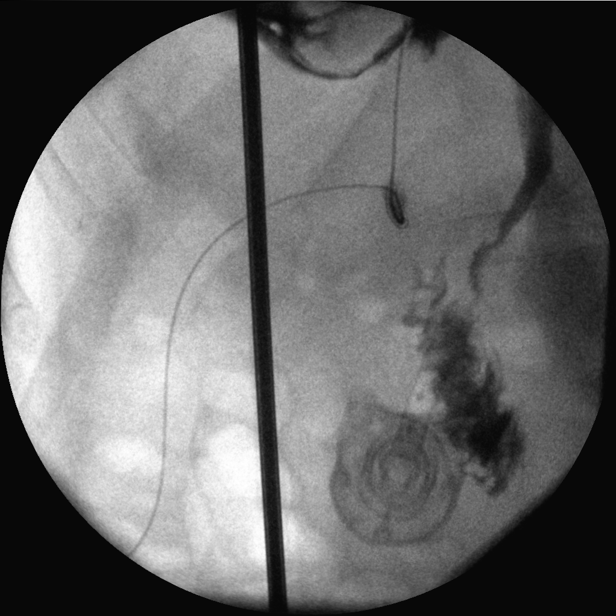
[frame 19/28]
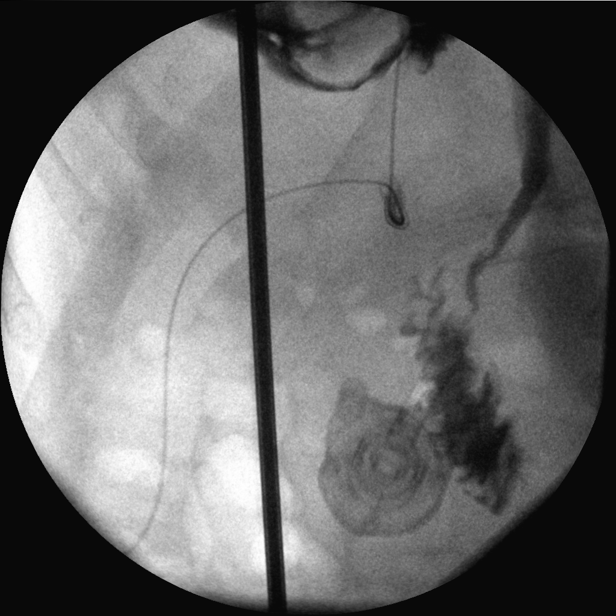
[frame 25/28]
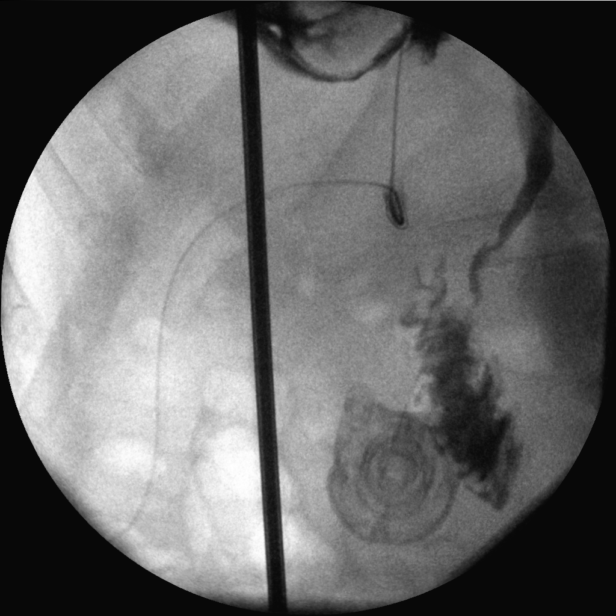

[14 of 34 positions shown; findings below may reference images not displayed]

FINDINGS: Contrast opacification of the extrahepatic biliary system. The
distal common bile duct is patent and there is contrast draining
into the duodenum. There is a large amount of contrast extravasation
near the cannulation site and limited evaluation of this area.
Cannot exclude nonobstructive linear sludge in the proximal common
bile duct.
IMPRESSION: Common bile duct is patent without obstructing lesions.

Cannot exclude nonobstructive sludge in the common bile duct.

Large amount of extravasation near the cannulation site.
